# Patient Record
Sex: Male | Born: 1970
Health system: Southern US, Community
[De-identification: ages and names within clinical notes are randomized; demographics above are authoritative.]

## PROBLEM LIST (undated history)

## (undated) DIAGNOSIS — R600 Localized edema: Secondary | ICD-10-CM

## (undated) DIAGNOSIS — N189 Chronic kidney disease, unspecified: Secondary | ICD-10-CM

## (undated) DIAGNOSIS — G4730 Sleep apnea, unspecified: Secondary | ICD-10-CM

## (undated) DIAGNOSIS — M1990 Unspecified osteoarthritis, unspecified site: Secondary | ICD-10-CM

## (undated) HISTORY — PX: REPLACEMENT TOTAL HIP W/  RESURFACING IMPLANTS: SUR1222

---

## 2003-04-29 ENCOUNTER — Encounter: Payer: Self-pay | Admitting: Emergency Medicine

## 2003-04-29 ENCOUNTER — Emergency Department (HOSPITAL_COMMUNITY): Admission: EM | Admit: 2003-04-29 | Discharge: 2003-04-29 | Payer: Self-pay | Admitting: Emergency Medicine

## 2007-04-01 ENCOUNTER — Ambulatory Visit: Payer: Self-pay | Admitting: Family Medicine

## 2011-03-11 ENCOUNTER — Encounter: Payer: Self-pay | Admitting: Nurse Practitioner

## 2011-03-28 ENCOUNTER — Encounter: Payer: Self-pay | Admitting: Nurse Practitioner

## 2011-04-27 ENCOUNTER — Encounter: Payer: Self-pay | Admitting: Nurse Practitioner

## 2012-10-06 ENCOUNTER — Ambulatory Visit: Payer: Self-pay | Admitting: Internal Medicine

## 2012-11-16 ENCOUNTER — Ambulatory Visit: Payer: Self-pay | Admitting: Physician Assistant

## 2012-11-22 ENCOUNTER — Ambulatory Visit: Payer: Self-pay | Admitting: Physician Assistant

## 2013-01-13 ENCOUNTER — Other Ambulatory Visit: Payer: Self-pay | Admitting: Anesthesiology

## 2013-01-18 ENCOUNTER — Encounter (HOSPITAL_COMMUNITY): Payer: Self-pay

## 2013-01-18 ENCOUNTER — Encounter (HOSPITAL_COMMUNITY)
Admission: RE | Admit: 2013-01-18 | Discharge: 2013-01-18 | Disposition: A | Discharge status: Home / Self Care | Payer: BC Managed Care – PPO | Source: Ambulatory Visit | Attending: Anesthesiology | Admitting: Anesthesiology

## 2013-01-18 HISTORY — DX: Localized edema: R60.0

## 2013-01-18 HISTORY — DX: Chronic kidney disease, unspecified: N18.9

## 2013-01-18 HISTORY — DX: Unspecified osteoarthritis, unspecified site: M19.90

## 2013-01-18 HISTORY — DX: Sleep apnea, unspecified: G47.30

## 2013-01-18 LAB — CBC
HCT: 44.2 % (ref 39.0–52.0)
Hemoglobin: 15 g/dL (ref 13.0–17.0)
MCH: 30.2 pg (ref 26.0–34.0)
MCV: 88.9 fL (ref 78.0–100.0)
RBC: 4.97 MIL/uL (ref 4.22–5.81)

## 2013-01-18 LAB — SURGICAL PCR SCREEN
MRSA, PCR: NEGATIVE
Staphylococcus aureus: NEGATIVE

## 2013-01-18 LAB — BASIC METABOLIC PANEL
CO2: 27 mEq/L (ref 19–32)
Calcium: 9.3 mg/dL (ref 8.4–10.5)
Chloride: 104 mEq/L (ref 96–112)
Glucose, Bld: 92 mg/dL (ref 70–99)
Potassium: 4.6 mEq/L (ref 3.5–5.1)
Sodium: 138 mEq/L (ref 135–145)

## 2013-01-18 NOTE — H&P (Signed)
[name] is an 42 y.o. male.   Chief Complaint: low back pain, radiculopathy HPI: 42 year old morbidly obese man with 4 year history of waxing and waning back pain, with advancing radiation into the buttocks and thighs. MRI demonstrates mild to moderate disc changes.    Past Medical History  Diagnosis Date  . Sleep apnea   . Chronic kidney disease     kidney stones  . Arthritis   . Edema leg     No past surgical history on file.  No family history on file. Social History:  reports that he has never smoked. He does not have any smokeless tobacco history on file. He reports that  drinks alcohol. He reports that he does not use illicit drugs.  Allergies: No Known Allergies  No prescriptions prior to admission    Results for orders placed during the hospital encounter of 01/18/13 (from the past 48 hour(s))  BASIC METABOLIC PANEL     Status: None   Collection Time    01/18/13 10:51 AM      Result Value Range   Sodium 138  135 - 145 mEq/L   Potassium 4.6  3.5 - 5.1 mEq/L   Chloride 104  96 - 112 mEq/L   CO2 27  19 - 32 mEq/L   Glucose, Bld 92  70 - 99 mg/dL   BUN 15  6 - 23 mg/dL   Creatinine, Ser 7.82  0.50 - 1.35 mg/dL   Calcium 9.3  8.4 - 95.6 mg/dL   GFR calc non Af Amer >90  >90 mL/min   GFR calc Af Amer >90  >90 mL/min   Comment:            The eGFR has been calculated     using the CKD EPI equation.     This calculation has not been     validated in all clinical     situations.     eGFR's persistently     <90 mL/min signify     possible Chronic Kidney Disease.  CBC     Status: None   Collection Time    01/18/13 10:51 AM      Result Value Range   WBC 8.7  4.0 - 10.5 K/uL   RBC 4.97  4.22 - 5.81 MIL/uL   Hemoglobin 15.0  13.0 - 17.0 g/dL   HCT 21.3  08.6 - 57.8 %   MCV 88.9  78.0 - 100.0 fL   MCH 30.2  26.0 - 34.0 pg   MCHC 33.9  30.0 - 36.0 g/dL   RDW 46.9  62.9 - 52.8 %   Platelets 217  150 - 400 K/uL  SURGICAL PCR SCREEN     Status: None   Collection Time    01/18/13 10:51 AM      Result Value Range   MRSA, PCR NEGATIVE  NEGATIVE   Staphylococcus aureus NEGATIVE  NEGATIVE   Comment:            The Xpert SA Assay (FDA     approved for NASAL specimens     in patients over 57 years of age),     is one component of     a comprehensive surveillance     program.  Test performance has     been validated by [employer] for patients greater     than or equal to 45 year old.     It is not intended  to diagnose infection nor to     guide or monitor treatment.   No results found.  Review of Systems  Constitutional: Negative.   HENT: Negative.  Negative for neck pain.   Eyes: Negative.   Respiratory: Negative.   Cardiovascular: Negative.   Gastrointestinal: Negative.   Musculoskeletal: Positive for back pain and joint pain. Negative for myalgias and falls.  Skin: Negative.   Endo/Heme/Allergies: Negative.   Psychiatric/Behavioral: Negative.     There were no vitals taken for this visit. Physical Exam  Constitutional: He is oriented to person, place, and time. He appears well-developed and well-nourished.  HENT:  Head: Normocephalic and atraumatic.  Eyes: EOM are normal. Pupils are equal, round, and reactive to light.  Neck: Normal range of motion.  Cardiovascular: Normal rate, regular rhythm and normal heart sounds.   Respiratory: Effort normal and breath sounds normal.  Musculoskeletal: Normal range of motion.  Neurological: He is alert and oriented to person, place, and time.  Skin: Skin is warm and dry.  Psychiatric: He has a normal mood and affect. His behavior is normal. Judgment and thought content normal.     Assessment/Plan 400+ lb individual with DDD Plan LESI at L4-5. Done in hospital setting due to patient's habitus requiring special equipment including bariatric bed, extra personnel.   Gwynne Edinger 01/19/2013, 3:14 PM

## 2013-01-18 NOTE — Pre-Procedure Instructions (Signed)
Aundra Espin  01/18/2013   Your procedure is scheduled on:  Wednesday, March 26th  Report to [facility] Stay Center at 100 PM.  Call this number if you have problems the morning of surgery: [phone]   Remember:   Do not eat food or drink liquids after midnight.    Take these medicines the morning of surgery with A SIP OF WATER: none   Do not wear jewelry, make-up or nail polish.  Do not wear lotions, powders, or perfumes,deodorant.  Do not shave 48 hours prior to surgery. Men may shave face and neck.  Do not bring valuables to the hospital.  Contacts, dentures or bridgework may not be worn into surgery.  Leave suitcase in the car. After surgery it may be brought to your room.  For patients admitted to the hospital, checkout time is 11:00 AM the day of discharge.   Patients discharged the day of surgery will not be allowed to drive home.   Special Instructions: Shower using CHG 2 nights before surgery and the night before surgery.  If you shower the day of surgery use CHG.  Use special wash - you have one bottle of CHG for all showers.  You should use approximately 1/3 of the bottle for each shower.   Please read over the following fact sheets that you were given: Pain Booklet, Coughing and Deep Breathing, MRSA Information and Surgical Site Infection Prevention

## 2013-01-19 ENCOUNTER — Ambulatory Visit (HOSPITAL_COMMUNITY)
Admission: RE | Admit: 2013-01-19 | Discharge: 2013-01-19 | Disposition: A | Discharge status: Home / Self Care | Payer: BC Managed Care – PPO | Source: Ambulatory Visit | Attending: Anesthesiology | Admitting: Anesthesiology

## 2013-01-19 ENCOUNTER — Ambulatory Visit (HOSPITAL_COMMUNITY): Payer: BC Managed Care – PPO

## 2013-01-19 ENCOUNTER — Ambulatory Visit (HOSPITAL_COMMUNITY): Payer: BC Managed Care – PPO | Admitting: Anesthesiology

## 2013-01-19 ENCOUNTER — Encounter (HOSPITAL_COMMUNITY)
Admission: RE | Disposition: A | Discharge status: Home / Self Care | Payer: Self-pay | Source: Ambulatory Visit | Attending: Anesthesiology

## 2013-01-19 ENCOUNTER — Encounter (HOSPITAL_COMMUNITY): Payer: Self-pay | Admitting: Anesthesiology

## 2013-01-19 ENCOUNTER — Encounter (HOSPITAL_COMMUNITY): Payer: Self-pay | Admitting: *Deleted

## 2013-01-19 DIAGNOSIS — M51379 Other intervertebral disc degeneration, lumbosacral region without mention of lumbar back pain or lower extremity pain: Secondary | ICD-10-CM | POA: Insufficient documentation

## 2013-01-19 DIAGNOSIS — M129 Arthropathy, unspecified: Secondary | ICD-10-CM | POA: Insufficient documentation

## 2013-01-19 DIAGNOSIS — M5137 Other intervertebral disc degeneration, lumbosacral region: Secondary | ICD-10-CM | POA: Insufficient documentation

## 2013-01-19 DIAGNOSIS — G4730 Sleep apnea, unspecified: Secondary | ICD-10-CM | POA: Insufficient documentation

## 2013-01-19 DIAGNOSIS — R609 Edema, unspecified: Secondary | ICD-10-CM | POA: Insufficient documentation

## 2013-01-19 HISTORY — PX: STERIOD INJECTION: SHX5046

## 2013-01-19 SURGERY — STEROID INJECTION
Anesthesia: Monitor Anesthesia Care | Site: Back | Wound class: Clean

## 2013-01-19 MED ORDER — FENTANYL CITRATE 0.05 MG/ML IJ SOLN
INTRAMUSCULAR | Status: DC | PRN
  Administered 2013-01-19: 50 ug via INTRAVENOUS

## 2013-01-19 MED ORDER — OXYCODONE HCL 5 MG PO TABS: 5.0000 mg | ORAL_TABLET | Freq: Once | ORAL | Status: DC | PRN

## 2013-01-19 MED ORDER — LACTATED RINGERS IV SOLN
INTRAVENOUS | Status: DC | PRN
  Administered 2013-01-19: 16:00:00 via INTRAVENOUS

## 2013-01-19 MED ORDER — MIDAZOLAM HCL 5 MG/5ML IJ SOLN
INTRAMUSCULAR | Status: DC | PRN
  Administered 2013-01-19: 2 mg via INTRAVENOUS

## 2013-01-19 MED ORDER — LIDOCAINE HCL (PF) 1 % IJ SOLN
INTRAMUSCULAR | Status: DC | PRN
  Administered 2013-01-19: 30 mL

## 2013-01-19 MED ORDER — IOHEXOL 300 MG/ML  SOLN
INTRAMUSCULAR | Status: DC | PRN
  Administered 2013-01-19: 2 mL

## 2013-01-19 MED ORDER — BUPIVACAINE HCL 0.25 % IJ SOLN
INTRAMUSCULAR | Status: DC | PRN
  Administered 2013-01-19: 1 mL

## 2013-01-19 MED ORDER — MEPERIDINE HCL 25 MG/ML IJ SOLN: 6.2500 mg | INTRAMUSCULAR | Status: DC | PRN

## 2013-01-19 MED ORDER — HYDROMORPHONE HCL PF 1 MG/ML IJ SOLN: 0.2500 mg | INTRAMUSCULAR | Status: DC | PRN

## 2013-01-19 MED ORDER — IOHEXOL 300 MG/ML  SOLN
INTRAMUSCULAR | Status: DC | PRN
  Administered 2013-01-19: 50 mL

## 2013-01-19 MED ORDER — OXYCODONE HCL 5 MG/5ML PO SOLN: 5.0000 mg | Freq: Once | ORAL | Status: DC | PRN

## 2013-01-19 MED ORDER — ONDANSETRON HCL 4 MG/2ML IJ SOLN: 4.0000 mg | Freq: Once | INTRAMUSCULAR | Status: DC | PRN

## 2013-01-19 MED ORDER — TRIAMCINOLONE ACETONIDE 40 MG/ML IJ SUSP: INTRAMUSCULAR | Status: DC | PRN

## 2013-01-19 MED ORDER — BUPIVACAINE HCL (PF) 0.25 % IJ SOLN: INTRAMUSCULAR | Status: DC | PRN

## 2013-01-19 MED ORDER — LIDOCAINE HCL 1 % IJ SOLN
INTRAMUSCULAR | Status: DC | PRN
  Administered 2013-01-19: 3 mL

## 2013-01-19 MED ORDER — TRIAMCINOLONE ACETONIDE 40 MG/ML IJ SUSP
INTRAMUSCULAR | Status: DC | PRN
  Administered 2013-01-19: 2 mL via INTRAMUSCULAR

## 2013-01-19 MED ORDER — TRIAMCINOLONE ACETONIDE 40 MG/ML IJ SUSP
40.0000 mg | INTRAMUSCULAR | Status: AC
  Administered 2013-01-19: 40 mg via INTRAMUSCULAR
  Filled 2013-01-19: qty 1

## 2013-01-19 SURGICAL SUPPLY — 14 items
CLOTH BEACON ORANGE TIMEOUT ST (SAFETY) ×2 IMPLANT
CONT SPEC STER OR (MISCELLANEOUS) ×8 IMPLANT
GLOVE BIOGEL PI IND STRL 7.0 (GLOVE) ×2 IMPLANT
GLOVE BIOGEL PI INDICATOR 7.0 (GLOVE) ×2
GLOVE ECLIPSE 7.5 STRL STRAW (GLOVE) ×2 IMPLANT
GLOVE EXAM NITRILE LRG STRL (GLOVE) IMPLANT
GLOVE EXAM NITRILE MD LF STRL (GLOVE) IMPLANT
GLOVE EXAM NITRILE XL STR (GLOVE) IMPLANT
GLOVE EXAM NITRILE XS STR PU (GLOVE) IMPLANT
GOWN BRE IMP SLV AUR XL STRL (GOWN DISPOSABLE) ×2 IMPLANT
GOWN STRL REIN 2XL LVL4 (GOWN DISPOSABLE) IMPLANT
KIT ROOM TURNOVER OR (KITS) ×2 IMPLANT
TOWEL OR 17X24 6PK STRL BLUE (TOWEL DISPOSABLE) ×2 IMPLANT
TOWEL OR 17X26 10 PK STRL BLUE (TOWEL DISPOSABLE) ×2 IMPLANT

## 2013-01-19 NOTE — Anesthesia Preprocedure Evaluation (Signed)
Anesthesia Evaluation  Patient identified by MRN, date of birth, ID band Patient awake    Reviewed: Allergy & Precautions, H&P , NPO status , Patient's Chart, lab work & pertinent test results  Airway Mallampati: II TM Distance: >3 FB Neck ROM: Full    Dental   Pulmonary sleep apnea ,          Cardiovascular     Neuro/Psych    GI/Hepatic   Endo/Other    Renal/GU      Musculoskeletal   Abdominal   Peds  Hematology   Anesthesia Other Findings   Reproductive/Obstetrics                           Anesthesia Physical Anesthesia Plan  ASA: III  Anesthesia Plan: MAC   Post-op Pain Management:    Induction: Intravenous  Airway Management Planned: Natural Airway  Additional Equipment:   Intra-op Plan:   Post-operative Plan:   Informed Consent: I have reviewed the patients History and Physical, chart, labs and discussed the procedure including the risks, benefits and alternatives for the proposed anesthesia with the patient or authorized representative who has indicated his/her understanding and acceptance.     Plan Discussed with: CRNA and Surgeon  Anesthesia Plan Comments:         Anesthesia Quick Evaluation

## 2013-01-19 NOTE — Anesthesia Postprocedure Evaluation (Signed)
Anesthesia Post Note  Patient: [name]  Procedure(s) Performed: Procedure(s) (LRB): STEROID INJECTION (N/A)  Anesthesia type: general  Patient location: PACU  Post pain: Pain level controlled  Post assessment: Patient's Cardiovascular Status Stable  Last Vitals:  Filed Vitals:   01/19/13 1611  BP: 117/62  Pulse: 73  Temp:   Resp: 28    Post vital signs: Reviewed and stable  Level of consciousness: sedated  Complications: No apparent anesthesia complications

## 2013-01-19 NOTE — Anesthesia Procedure Notes (Signed)
Procedure Name: MAC Date/Time: 01/19/2013 3:35 PM Performed by: Angelica Pou Pre-anesthesia Checklist: Patient identified, Emergency Drugs available, Suction available, Patient being monitored and Timeout performed Oxygen Delivery Method: Nasal cannula

## 2013-01-19 NOTE — Op Note (Signed)
PREOP DX: POSTOP DX:  PROCEDURE: SURGEON: Omunique Pederson ANESTH: mac + local  PROCEDURE: 1) Intraop flouro   2) lumbar epidural steroid injection at L4-5     DESCRIPTION: After a discussion of risks, benefits and alternatives, informed consent was obtained. The patient was taken to the OR, placed on a bariatric bed in the prone position, MAC anesthesia provided by the anesthesia team.   After a timeout was taken and all things deemed to be appropriate, the area overlying the lumbar spine was prepped with chloraprep and draped. AP fluoro was used to identify the L4-5 interspace. The skin and subcutaneous tissues were anesthetized with 1% lidocaine, and a 18g Tuohy needle introduced and advanced under intermittent fluoroscopy to direct the needle in a midline fashion. Biplanar fluoro as well as loss-of-resistance technique were used to enter the epidural space and under live AP and lateral fluoro a good epidurogram was obtained injecting approximately 3 cc of omnipaque contrast. Subsequently, 80 mg of triamcinalone and 1cc of 0.25 % bupivicaine were incrementially injected. The needle was withdrawn intact, hemostasis was appreciated, and the patient taken to the recovery area.   COMPLICATIONS: none CONDITION: stable throughout DISPOSITION: home with followup to be established

## 2013-01-19 NOTE — Transfer of Care (Signed)
Immediate Anesthesia Transfer of Care Note  Patient: [name]  Procedure(s) Performed: Procedure(s) with comments: STEROID INJECTION (N/A) - Lumbar four - five Lumbar Epidural Steroid Injection  Patient Location: PACU  Anesthesia Type:MAC  Level of Consciousness: awake, alert , oriented and patient cooperative  Airway & Oxygen Therapy: Patient Spontanous Breathing  Post-op Assessment: Report given to PACU RN, Post -op Vital signs reviewed and stable and Patient moving all extremities X 4  Post vital signs: Reviewed and stable  Complications: No apparent anesthesia complications

## 2013-01-19 NOTE — Preoperative (Signed)
Beta Blockers   Reason not to administer Beta Blockers:Not Applicable 

## 2013-01-20 ENCOUNTER — Encounter (HOSPITAL_COMMUNITY): Payer: Self-pay | Admitting: Anesthesiology

## 2013-06-24 ENCOUNTER — Ambulatory Visit: Payer: Self-pay | Admitting: Physician Assistant

## 2013-07-08 ENCOUNTER — Ambulatory Visit: Payer: Self-pay | Admitting: Orthopedic Surgery

## 2014-10-27 IMAGING — CT CT STONE STUDY
1 of 2 series · 16 of 32 positions shown, 20 images · non-contrast
Comparison: none

REASON FOR EXAM: Hematuria Hx Kidney Stones
COMMENTS:

PROCEDURE:     KCT - KCT ABDOMEN/PELVIS WO (STONE)  - October 06, 2012  [DATE]
RESULT:     History: Hematuria. Kidney stones.
Comparison Study: No prior.

[Series 2: abdpel_xl 3.0 i40s 3 · axial · 0.98mm/px · z∈[-1253,-821]mm · 16 of 158 slices shown, 20 images]
[im 7/158  soft-tissue]
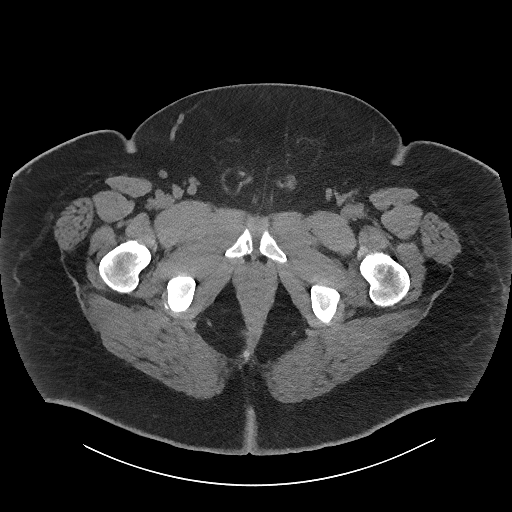
[im 7/158  bone]
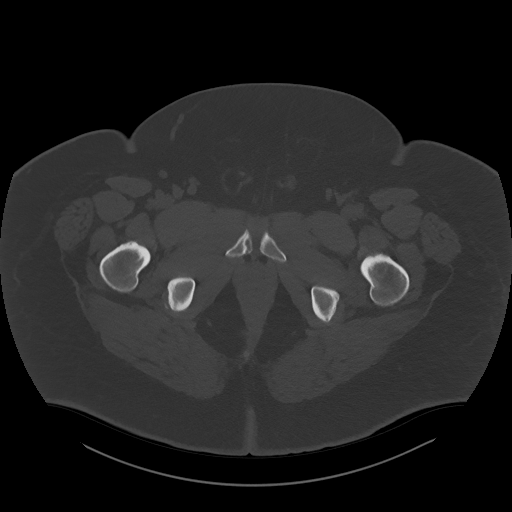
[im 19/158  soft-tissue]
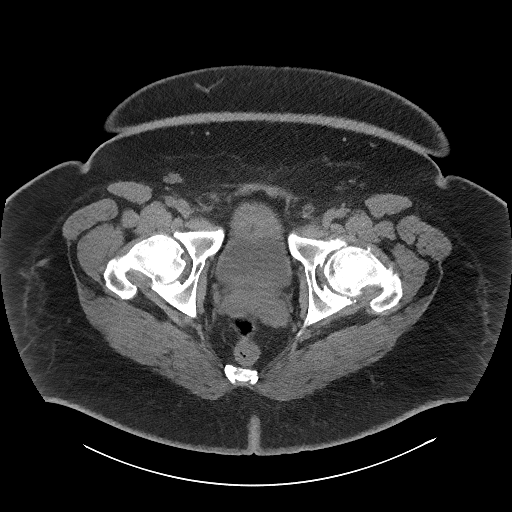
[im 31/158  soft-tissue]
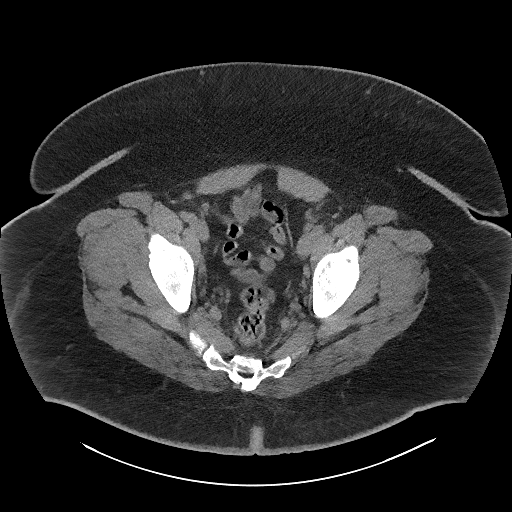
[im 43/158  soft-tissue]
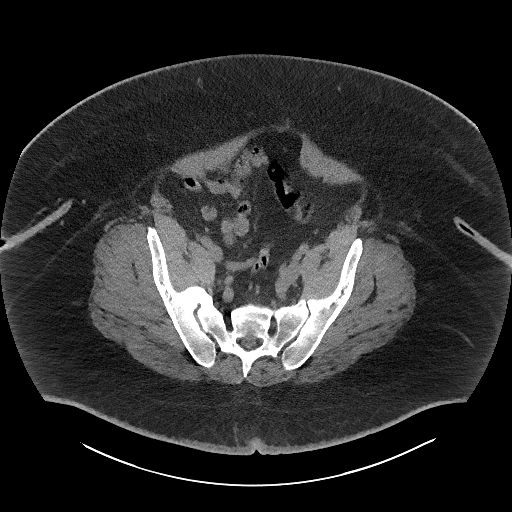
[im 55/158  soft-tissue]
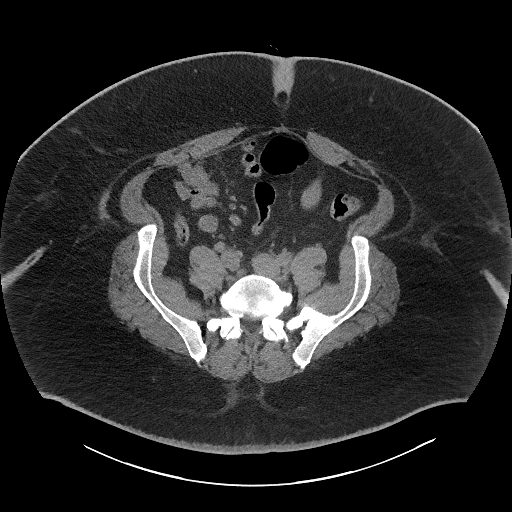
[im 61/158  soft-tissue]
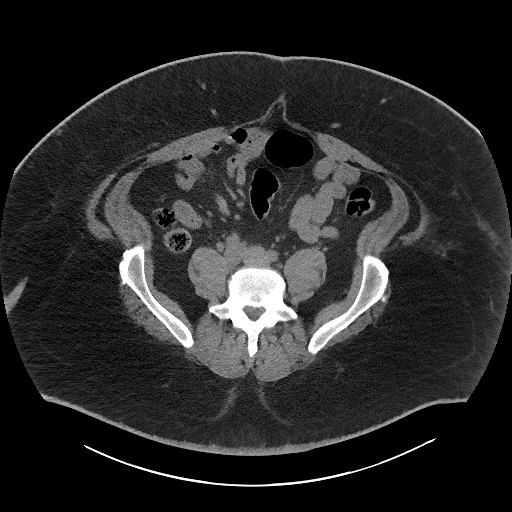
[im 73/158  soft-tissue]
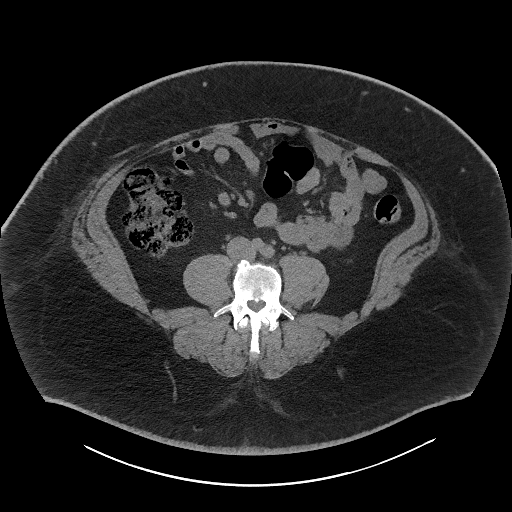
[im 85/158  soft-tissue]
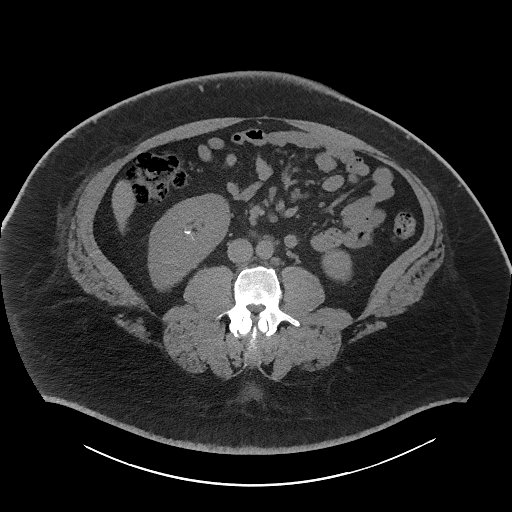
[im 97/158  soft-tissue]
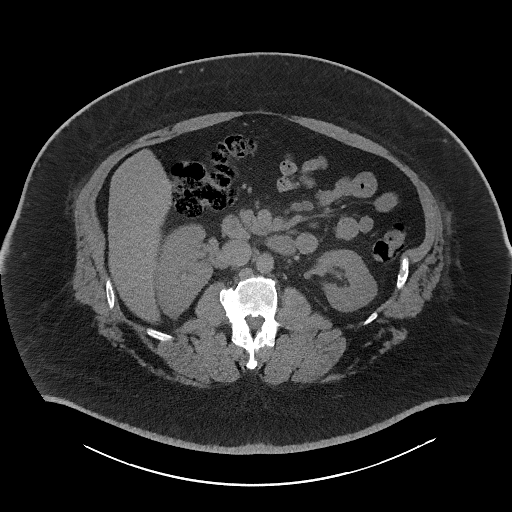
[im 97/158  bone]
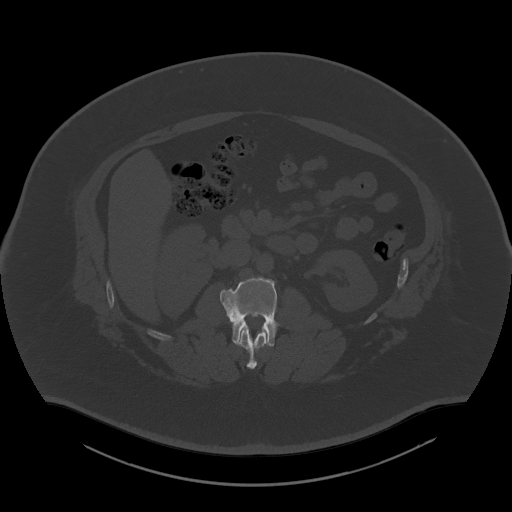
[im 103/158  soft-tissue]
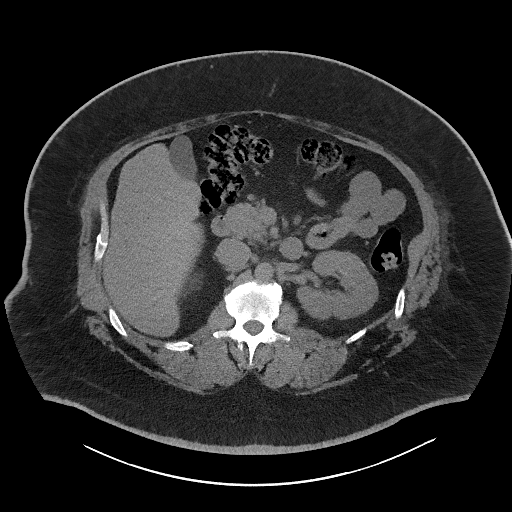
[im 115/158  soft-tissue]
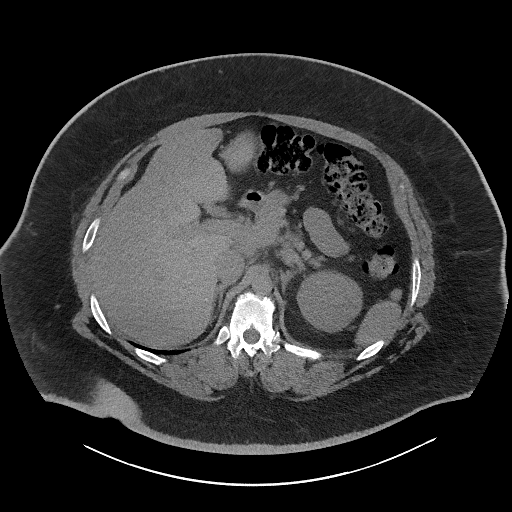
[im 127/158  soft-tissue]
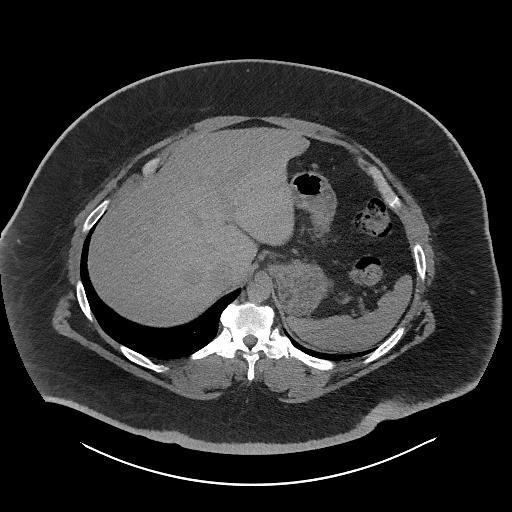
[im 133/158  lung]
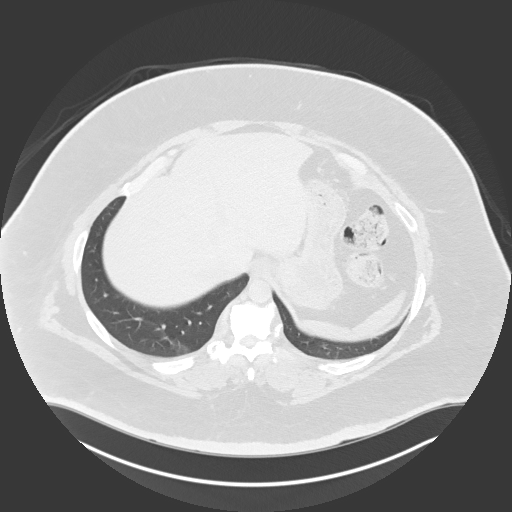
[im 139/158  soft-tissue]
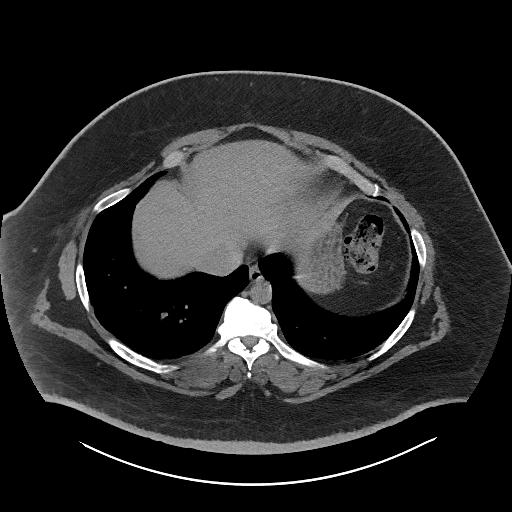
[im 139/158  lung]
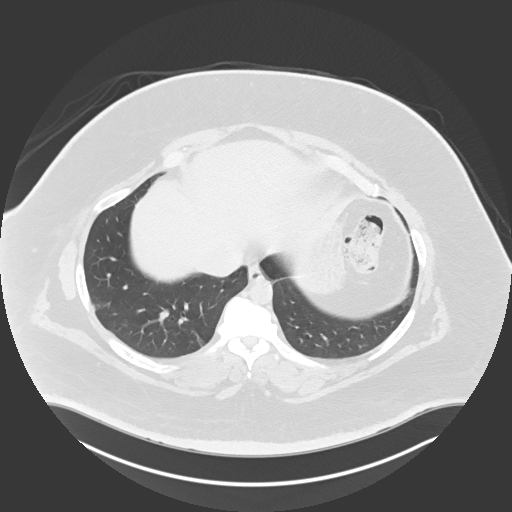
[im 145/158  lung]
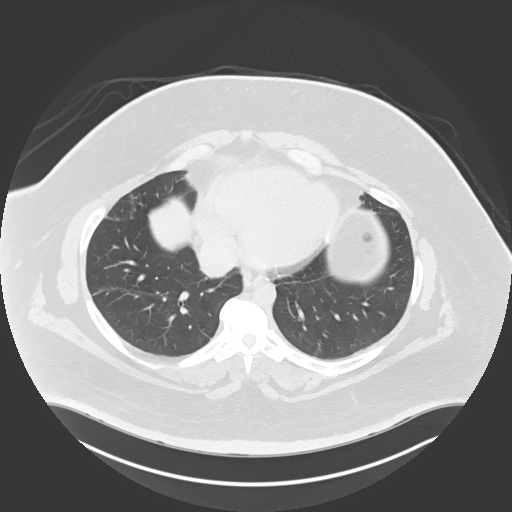
[im 151/158  soft-tissue]
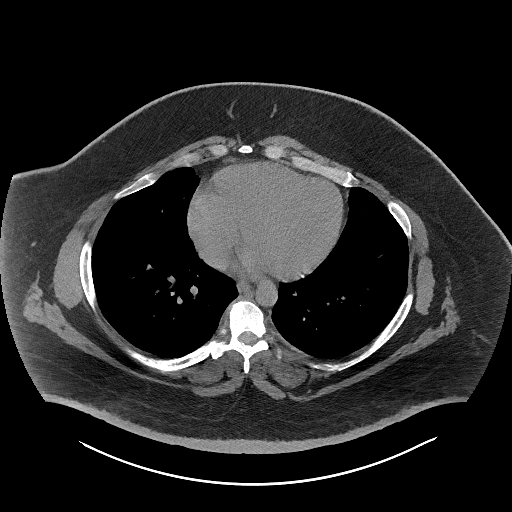
[im 151/158  lung]
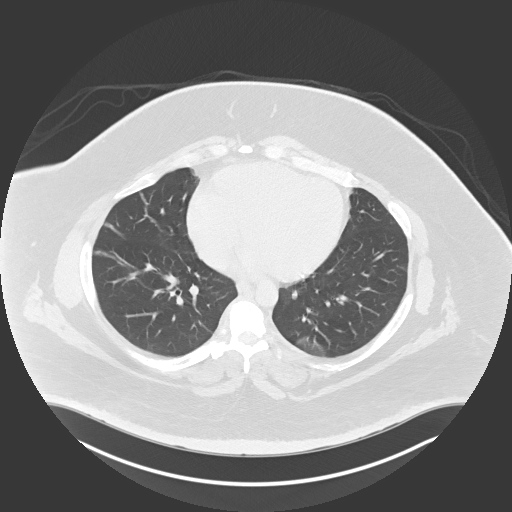

[16 of 32 positions shown; findings below may reference images not displayed]

FINDINGS: Standard nonenhanced CT obtained. Liver normal. Gallbladder
normal. Spleen normal. Pancreas normal. Adrenals normal. Bilateral
nephrolithiasis. No hydronephrosis or hydroureter. Aorta normal caliber.
Shotty retroperitoneal lymph nodes. Appendiceal region normal. Appendix not
definitely identified. No inflammatory changes in the right or left lower
quadrant. There is no bowel distention. No free air. Mild atelectasis lung
bases.
IMPRESSION: Bilateral tiny renal calyceal stones. No evidence of
obstructing ureteral stone.

## 2015-02-25 ENCOUNTER — Emergency Department
Admission: EM | Admit: 2015-02-25 | Discharge: 2015-02-26 | Disposition: A | Discharge status: Home / Self Care | Payer: No Typology Code available for payment source | Attending: Emergency Medicine | Admitting: Emergency Medicine

## 2015-02-25 ENCOUNTER — Encounter: Payer: Self-pay | Admitting: *Deleted

## 2015-02-25 DIAGNOSIS — Z791 Long term (current) use of non-steroidal anti-inflammatories (NSAID): Secondary | ICD-10-CM | POA: Insufficient documentation

## 2015-02-25 DIAGNOSIS — K5641 Fecal impaction: Secondary | ICD-10-CM | POA: Insufficient documentation

## 2015-02-25 DIAGNOSIS — Z79899 Other long term (current) drug therapy: Secondary | ICD-10-CM | POA: Insufficient documentation

## 2015-02-25 MED ORDER — FLEET ENEMA 7-19 GM/118ML RE ENEM
1.0000 | ENEMA | Freq: Once | RECTAL | Status: AC
  Administered 2015-02-25: 1 via RECTAL

## 2015-02-25 NOTE — ED Notes (Signed)
Pt resent post-op hip surgery. Pt presents w/ c/o rectal and abdominal pain consistent w/ obstipation secondary to narcotic use. Pt states last normal x 4 days ago. Pt has tachypnea, is pale and diaphoretic. Pt states he can feel stool in rectum but cannot eliminate.

## 2015-02-25 NOTE — ED Provider Notes (Signed)
[facility] Emergency Department Provider Note    ____________________________________________  Time seen: 10:20 PM  I have reviewed the triage vital signs and the nursing notes.   HISTORY  Chief Complaint Fecal Impaction       HPI [name] is a 44 y.o. male with a left hip replacement on April 19 and presents today without a bowel movement in 3-4 days. The patient says he feels stool at his anal verge but is unable to push it out. He says that coming in waves he is having severe pain with distress. He says that his wife tried to manually disimpact him at home however was unsuccessful. He also denies passing gas. He does not have any nausea or vomiting. The pain has been worsening over the past 3 days. He says he is taking 2 doses of oxycodone every 4 hours with his last dose at 8 AM this morning.  At this time he says he has abdominal discomfort with rectal pain. He is not experiencing any pain in his left hip. He says that he has not needed the oxycodone for substance 8 AM this morning and plans to wean himself off.   Past Medical History  Diagnosis Date  . Sleep apnea   . Chronic kidney disease     kidney stones  . Arthritis   . Edema leg     There are no active problems to display for this patient.   Past Surgical History  Procedure Laterality Date  . Steriod injection N/A 01/19/2013    Procedure: STEROID INJECTION;  Surgeon: Gwynne Edinger, MD;  Location: [facility];  Service: Neurosurgery;  Laterality: N/A;  Lumbar four - five Lumbar Epidural Steroid Injection  . Replacement total hip w/  resurfacing implants Left     Current Outpatient Rx  Name  Route  Sig  Dispense  Refill  . oxycodone (OXY-IR) 5 MG capsule   Oral   Take 5 mg by mouth every 4 (four) hours as needed.         . phentermine 37.5 MG capsule   Oral   Take 37.5 mg by mouth every morning.         . rivaroxaban (XARELTO) 10 MG TABS tablet   Oral   Take 10 mg  by mouth daily.         Marland Kitchen senna (SENOKOT) 8.6 MG tablet   Oral   Take 1 tablet by mouth daily.         . cyclobenzaprine (FLEXERIL) 10 MG tablet   Oral   Take 10 mg by mouth 3 (three) times daily as needed for muscle spasms.         . furosemide (LASIX) 20 MG tablet   Oral   Take 20 mg by mouth daily as needed (leg swelling).         . naproxen sodium (ANAPROX) 220 MG tablet   Oral   Take 220 mg by mouth 2 (two) times daily with a meal.           Allergies Review of patient's allergies indicates no known allergies.  No family history on file.  Social History History  Substance Use Topics  . Smoking status: Never Smoker   . Smokeless tobacco: Never Used  . Alcohol Use: Yes     Comment: occasionally    Review of Systems  Constitutional: Negative for fever. Eyes: Negative for visual changes. ENT: Negative for sore throat. Cardiovascular: Negative for chest pain.  Respiratory: Negative for shortness of breath. Gastrointestinal: Abdominal discomfort without pain. Which is suprapubic. Genitourinary: Negative for dysuria. Musculoskeletal: Negative for back pain. Skin: Negative for rash. Neurological: Negative for headaches, focal weakness or numbness.   10-point ROS otherwise negative.  ____________________________________________   PHYSICAL EXAM:  VITAL SIGNS: ED Triage Vitals  Enc Vitals Group     BP 02/25/15 2156 100/75 mmHg     Pulse Rate 02/25/15 2152 113     Resp 02/25/15 2152 28     Temp 02/25/15 2152 98.6 F (37 C)     Temp Source 02/25/15 2152 Oral     SpO2 02/25/15 2152 100 %     Weight 02/25/15 2152 375 lb (170.099 kg)     Height 02/25/15 2152 6' (1.829 m)     Head Cir --      Peak Flow --      Pain Score --      Pain Loc --      Pain Edu? --      Excl. in GC? --      Constitutional: Alert and oriented. Patient walking around with walker. Screaming and diaphoretic.  Eyes: Conjunctivae are normal. PERRL. Normal extraocular  movements. ENT   Head: Normocephalic and atraumatic.   Nose: No congestion/rhinnorhea.   Mouth/Throat: Mucous membranes are moist.   Neck: No stridor. Hematological/Lymphatic/Immunilogical: No cervical lymphadenopathy. Cardiovascular: Normal rate, regular rhythm. Normal and symmetric distal pulses are present in all extremities. No murmurs, rubs, or gallops. Respiratory: Normal respiratory effort without tachypnea nor retractions. Breath sounds are clear and equal bilaterally. No wheezes/rales/rhonchi. Gastrointestinal: Soft and nontender. No distention. No abdominal bruits. There is no CVA tenderness. Small external hemorrhoid which is not engorged at 6:00. Genitourinary: Deferred Musculoskeletal: Nontender with normal range of motion in all extremities. No joint effusions.  Bilateral lower extremity edema which the patient says he has had since his surgery. Left hip dressing CDI. The incision site is without surrounding erythema without any signs of dehiscence or pus. Neurologic:  Normal speech and language. No gross focal neurologic deficits are appreciated. Speech is normal. No gait instability. Skin:  Skin is warm, dry and intact. No rash noted. Psychiatric: Mood and affect are normal. Speech and behavior are normal. Patient exhibits appropriate insight and judgment.  ____________________________________________   EKG   ____________________________________________    RADIOLOGY    ____________________________________________   PROCEDURES  Procedure(s) performed: fecal, see procedure note(s). Patient on 2 doses of oxycodone every 4 for left hip pain without bowel movement in 3-4 days. Patient laid in right lateral decubitus. Double gloved with gown. Use lubricant. Able to digitally disimpact a large amount of hard stool in the rectum. Grossly brown. 3 passes with large disimpaction but still with large amount of stool in vault. Stopped due to small amount of blood  mixed in with stool on the third pass. Will attempt enema on patient.   ____________________________________________   INITIAL IMPRESSION / ASSESSMENT AND PLAN / ED COURSE  Pertinent labs & imaging results that were available during my care of the patient were reviewed by me and considered in my medical decision making (see chart for details).  ----------------------------------------- 11:43 PM on 02/25/2015 -----------------------------------------  Patient reassessed and pain is greatly improved. Heart rate is 90 on the monitor. Patient received enema. However, he was not able to have any solid bowel movement. He did pass a small amount of liquid stool as well as a small amount of flatus. The patient says he is not  any need of continuing his oxycodone. He will also continue his stool softeners at home as needed. Will replace dressing over incision site.  ____________________________________________   FINAL CLINICAL IMPRESSION(S) / ED DIAGNOSES  Acute fecal impaction secondary to opiate use after surgery. Acute, and initial visit.   Arelia Longest, MD 02/25/15 [phone]

## 2015-02-25 NOTE — Discharge Instructions (Signed)
Fecal Impaction °A fecal impaction happens when there is a large, firm amount of stool (or feces) that cannot be passed. The impacted stool is usually in the rectum, which is the lowest part of the large bowel. The impacted stool can block the colon and cause significant problems. °CAUSES  °The longer stool stays in the rectum, the harder it gets. Anything that slows down your bowel movements can lead to fecal impaction, such as: °· Constipation. This can be a long-standing (chronic) problem or can happen suddenly (acute). °· Painful conditions of the rectum, such as hemorrhoids or anal fissures. The pain of these conditions can make you try to avoid having bowel movements. °· Narcotic pain-relieving medicines, such as methadone, morphine, or codeine. °· Not drinking enough fluids. °· Inactivity and bed rest over long periods of time. °· Diseases of the brain or nervous system that damage the nerves controlling the muscles of the intestines. °SIGNS AND SYMPTOMS  °· Lack of normal bowel movements or changes in bowel patterns. °· Sense of fullness in the rectum but unable to pass stool. °· Pain or cramps in the abdominal area (often after meals). °· Thin, watery discharge from the rectum. °DIAGNOSIS  °Your health care provider may suspect that you have a fecal impaction based on your symptoms and a physical exam. This will include an exam of your rectum. Sometimes X-rays or lab testing may be needed to confirm the diagnosis and to be sure there are no other problems.  °TREATMENT  °· Initially an impaction can be removed manually. Using a gloved finger, your health care provider can remove hard stool from your rectum. °· Medicine is sometimes needed. A suppository or enema can be given in the rectum to soften the stool, which can stimulate a bowel movement. Medicines can also be given by mouth (orally). °· Though rare, surgery may be needed if the colon has torn (perforated) due to blockage. °HOME CARE INSTRUCTIONS   °· Develop regular bowel habits. This could include getting in the habit of having a bowel movement after your morning cup of coffee or after eating. Be sure to allow yourself enough time on the toilet. °· Maintain a high-fiber diet. °· Drink enough fluids to keep your urine clear or pale yellow as directed by your health care provider. °· Exercise regularly. °· If you begin to get constipated, increase the amount of fiber in your diet. Eat plenty of fruits, vegetables, whole wheat breads, bran, oatmeal, and similar products. °· Take natural fiber laxatives or other laxatives only as directed by your health care provider. °SEEK MEDICAL CARE IF:  °· You have ongoing rectal pain. °· You require enemas or suppositories more than twice a week. °· You have rectal bleeding. °· You have continued problems, or you develop abdominal pain. °· You have thin, pencil-like stools. °SEEK IMMEDIATE MEDICAL CARE IF:  °You have black or tarry stools. °MAKE SURE YOU:  °· Understand these instructions. °· Will watch your condition. °· Will get help right away if you are not doing well or get worse. °Document Released: 07/05/2004 Document Revised: 08/03/2013 Document Reviewed: 04/19/2013 °[employer]® Patient Information ©2015 [employer]. This information is not intended to replace advice given to you by your health care provider. Make sure you discuss any questions you have with your health care provider. ° °

## 2015-02-26 NOTE — ED Notes (Signed)
Pt reports good relief of discomfort and enema effective in resulting in large formed BM; pt deemed medically cleared for discharge by ED provider; pt accomp by SO; voices good understanding of d/c and f/u; pt taken to discharge desk via w/c

## 2015-05-14 ENCOUNTER — Encounter: Payer: No Typology Code available for payment source | Attending: Surgery | Admitting: Surgery

## 2015-05-14 DIAGNOSIS — I890 Lymphedema, not elsewhere classified: Secondary | ICD-10-CM | POA: Diagnosis not present

## 2015-05-14 DIAGNOSIS — L97212 Non-pressure chronic ulcer of right calf with fat layer exposed: Secondary | ICD-10-CM | POA: Insufficient documentation

## 2015-05-14 DIAGNOSIS — I872 Venous insufficiency (chronic) (peripheral): Secondary | ICD-10-CM | POA: Diagnosis not present

## 2015-05-14 DIAGNOSIS — I87313 Chronic venous hypertension (idiopathic) with ulcer of bilateral lower extremity: Secondary | ICD-10-CM | POA: Diagnosis not present

## 2015-05-14 DIAGNOSIS — G4730 Sleep apnea, unspecified: Secondary | ICD-10-CM | POA: Insufficient documentation

## 2015-05-14 DIAGNOSIS — M069 Rheumatoid arthritis, unspecified: Secondary | ICD-10-CM | POA: Diagnosis not present

## 2015-05-14 DIAGNOSIS — L97222 Non-pressure chronic ulcer of left calf with fat layer exposed: Secondary | ICD-10-CM | POA: Insufficient documentation

## 2015-05-15 NOTE — Progress Notes (Signed)
[name] ([id number]) Visit Report for 05/14/2015 Chief Complaint Document Details Patient Name: [name], [name]. Date of Service: 05/14/2015 9:00 AM Medical Record Number: [id number] Patient Account Number: [id number] Date of Birth/Sex: 05/10/1971 (44 y.o. Male) Treating RN: Primary Care Physician: [name], [name] Other Clinician: Referring Physician: Mayford [name], [name] Treating Physician/Extender: [name] in Treatment: 0 Information Obtained from: Patient Chief Complaint Patient presents for treatment of an open ulcer due to venous insufficiency. 44 year old gentleman who has bilateral lower leg swelling and ulceration for the last 3 months. Electronic Signature(s) Signed: 05/14/2015 10:18:01 AM By: Evlyn Kanner MD, FACS Entered By: Evlyn Kanner on 05/14/2015 10:18:01 Biever, [name] ([id number]) -------------------------------------------------------------------------------- HPI Details Patient Name: Oscar Mirza C. Date of Service: 05/14/2015 9:00 AM Medical Record Number: [id number] Patient Account Number: [id number] Date of Birth/Sex: Apr 17, 1971 (44 y.o. Male) Treating RN: Primary Care Physician: [name], [name] Other Clinician: Referring Physician: TURNER, [name] Treating Physician/Extender: [name] in Treatment: 0 History of Present Illness Location: bilateral lower extremity swelling and ulceration Quality: Patient reports experiencing a dull pain to affected area(s). Severity: Patient states wound are getting worse. Duration: Patient has had the wound for > 3 months prior to seeking treatment at the wound center Timing: Pain in wound is Intermittent (comes and goes Context: The wound appeared gradually over time Modifying Factors: Other treatment(s) tried include: has seen his PCP and has been on Keflex. Associated Signs and Symptoms: Patient reports having difficulty standing for long periods. HPI Description: This 44 year old gentleman has  been referred from his PCPs office where he was seen by Oscar Sheets, PA. The patient is known to be seen most recently for morbidly obese, sleep apnea, chronic lower extremity edema, osteoarthritis, pain in the legs. His past medical history is significant for chronic venous insufficiency, pain in the back, morbid obesity, cardiomegaly, osteoarthritis of the hip, sleep apnea, kidney stones, hematuria, ulcers both lower extremities. He had a left hip arthroplasty in April of this year. He has never had venous duplex studies done on his lower extremities though he has been to the wound center several years ago. He has also not been wearing his compression stockings. Electronic Signature(s) Signed: 05/14/2015 10:19:11 AM By: Evlyn Kanner MD, FACS Previous Signature: 05/14/2015 9:44:09 AM Version By: Evlyn Kanner MD, FACS Previous Signature: 05/14/2015 9:37:49 AM Version By: Evlyn Kanner MD, FACS Entered By: Evlyn Kanner on 05/14/2015 10:19:11 Glaude, [name] ([id number]) -------------------------------------------------------------------------------- Physical Exam Details Patient Name: [name], [name] Date of Service: 05/14/2015 9:00 AM Medical Record Number: [id number] Patient Account Number: [id number] Date of Birth/Sex: 1971-08-05 (44 y.o. Male) Treating RN: Primary Care Physician: [name], [name] Other Clinician: Referring Physician: Mayford [name], [name] Treating Physician/Extender: [name] in Treatment: 0 Constitutional . Pulse regular. Respirations normal and unlabored. Afebrile. . Eyes Nonicteric. Reactive to light. Ears, Nose, Mouth, and Throat Lips, teeth, and gums WNL.Marland Kitchen Moist mucosa without lesions . Neck supple and nontender. No palpable supraclavicular or cervical adenopathy. Normal sized without goiter. Respiratory WNL. No retractions.. Breath sounds WNL, No rubs, rales, rhonchi, or wheeze.. Cardiovascular Pedal Pulses WNL. ABI on the left is 1.25) of the right is  1.11. he has bilateral lower extremity edema.. Gastrointestinal (GI) Abdomen without masses or tenderness.. No liver or spleen enlargement or tenderness.. Musculoskeletal Adexa without tenderness or enlargement.. Digits and nails w/o clubbing, cyanosis, infection, petechiae, ischemia, or inflammatory conditions.. Integumentary (Hair, Skin) No suspicious lesions. No crepitus or fluctuance. No peri-wound warmth or erythema. No masses.Marland Kitchen Psychiatric Judgement and insight Intact.. No evidence  of depression, anxiety, or agitation.. Notes He has significant ulceration on the right lateral lower extremity and also on the left lateral lower extremity. In some areas in the left lower extremity that hyperbaric granulation tissue. Minimal debris has been washed off with saline gauze. Electronic Signature(s) Signed: 05/14/2015 10:20:44 AM By: Evlyn Kanner MD, FACS Entered By: Evlyn Kanner on 05/14/2015 10:20:44 Moffa, [name] ([id number]) -------------------------------------------------------------------------------- Physician Orders Details Patient Name: Oscar Mirza C. Date of Service: 05/14/2015 9:00 AM Medical Record Number: [id number] Patient Account Number: [id number] Date of Birth/Sex: May 14, 1971 (44 y.o. Male) Treating RN: [name] Primary Care Physician: [name], [name] Other Clinician: Referring Physician: Mayford [name], [name] Treating Physician/Extender: [name] in Treatment: 0 Verbal / Phone Orders: Yes Clinician: Huel [name] Read Back and Verified: Yes Diagnosis Coding Wound Cleansing Wound #1 Right,Lateral Lower Leg o Clean wound with Normal Saline. Wound #2 Left,Lateral Lower Leg o Clean wound with Normal Saline. Wound #3 Left,Posterior Lower Leg o Clean wound with Normal Saline. Anesthetic Wound #1 Right,Lateral Lower Leg o Topical Lidocaine 4% cream applied to wound bed prior to debridement Wound #2 Left,Lateral Lower Leg o Topical Lidocaine 4% cream  applied to wound bed prior to debridement Wound #3 Left,Posterior Lower Leg o Topical Lidocaine 4% cream applied to wound bed prior to debridement Primary Wound Dressing Wound #1 Right,Lateral Lower Leg o Aquacel Ag Wound #2 Left,Lateral Lower Leg o Aquacel Ag Wound #3 Left,Posterior Lower Leg o Aquacel Ag Secondary Dressing Wound #1 Right,Lateral Lower Leg o ABD pad Wound #2 Left,Lateral Lower Leg o ABD pad [name], [name] ([id number]) Wound #3 Left,Posterior Lower Leg o ABD pad Dressing Change Frequency Wound #1 Right,Lateral Lower Leg o Change dressing every week Wound #2 Left,Lateral Lower Leg o Change dressing every week Wound #3 Left,Posterior Lower Leg o Change dressing every week Follow-up Appointments Wound #1 Right,Lateral Lower Leg o Return Appointment in 1 week. o Nurse Visit as needed - Thursday for [facility] Change Wound #2 Left,Lateral Lower Leg o Return Appointment in 1 week. o Nurse Visit as needed - Thursday for [facility] Change Wound #3 Left,Posterior Lower Leg o Return Appointment in 1 week. o Nurse Visit as needed - Thursday for [facility] Change Edema Control Wound #1 Right,Lateral Lower Leg o Unna Boots Bilaterally o Elevate legs to the level of the heart and pump ankles as often as possible Wound #2 Left,Lateral Lower Leg o Unna Boots Bilaterally o Elevate legs to the level of the heart and pump ankles as often as possible Wound #3 Left,Posterior Lower Leg o Unna Boots Bilaterally o Elevate legs to the level of the heart and pump ankles as often as possible Additional Orders / Instructions Wound #1 Right,Lateral Lower Leg o Increase protein intake. o OK to return to work o Activity as tolerated o Other: - C-pap to be used every night [name], [name] ([id number]) Wound #2 Left,Lateral Lower Leg o Increase protein intake. o OK to return to work o Activity as tolerated o  Other: - C-pap to be used every night Wound #3 Left,Posterior Lower Leg o Increase protein intake. o OK to return to work o Activity as tolerated o Other: - C-pap to be used every night Medications-please add to medication list. o P.O. Antibiotics - Complete Keflex Services and Therapies o Venous Duplex Doppler - [facility] referral oooo Electronic Signature(s) Signed: 05/14/2015 12:32:38 PM By: Evlyn Kanner MD, FACS Signed: 05/14/2015 5:24:22 PM By: Elliot Gurney, RN, BSN, Kim RN, BSN Entered By: Elliot Gurney, RN, BSN,  Kim on 05/14/2015 10:12:39 Barthold, Clem CMarland Kitchen ([id number]) -------------------------------------------------------------------------------- Problem List Details Patient Name: Mapp, Davarious C. Date of Service: 05/14/2015 9:00 AM Medical Record Number: [id number] Patient Account Number: [id number] Date of Birth/Sex: 06-24-1971 (44 y.o. 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 layer 05/14/2015 Yes exposed L97.222 Non-pressure chronic ulcer of left calf with fat layer 05/14/2015 Yes exposed E66.01 Morbid (severe) obesity due to excess calories 05/14/2015 Yes I89.0 Lymphedema, not elsewhere classified 05/14/2015 Yes Inactive Problems Resolved Problems Electronic Signature(s) Signed: 05/14/2015 10:17:28 AM By: Evlyn Kanner MD, FACS Previous Signature: 05/14/2015 10:17:15 AM Version By: Evlyn Kanner MD, FACS Entered By: Evlyn Kanner on 05/14/2015 10:17:28 Autry, [name] ([id number]) -------------------------------------------------------------------------------- Progress Note Details Patient Name: [name],  [name] Date of Service: 05/14/2015 9:00 AM Medical Record Number: [id number] Patient Account Number: [id number] Date of Birth/Sex: 02/04/1971 (44 y.o. Male) Treating RN: Primary Care Physician: [name], [name] Other Clinician: Referring Physician: Mayford [name], [name] Treating Physician/Extender: [name] in Treatment: 0 Subjective Chief Complaint Information obtained from Patient Patient presents for treatment of an open ulcer due to venous insufficiency. 44 year old gentleman who has bilateral lower leg swelling and ulceration for the last 3 months. History of Present Illness (HPI) The following HPI elements were documented for the patient's wound: Location: bilateral lower extremity swelling and ulceration Quality: Patient reports experiencing a dull pain to affected area(s). Severity: Patient states wound are getting worse. Duration: Patient has had the wound for > 3 months prior to seeking treatment at the wound center Timing: Pain in wound is Intermittent (comes and goes Context: The wound appeared gradually over time Modifying Factors: Other treatment(s) tried include: has seen his PCP and has been on Keflex. Associated Signs and Symptoms: Patient reports having difficulty standing for long periods. This 44 year old gentleman has been referred from his PCPs office where he was seen by Oscar Sheets, PA. The patient is known to be seen most recently for morbidly obese, sleep apnea, chronic lower extremity edema, osteoarthritis, pain in the legs. His past medical history is significant for chronic venous insufficiency, pain in the back, morbid obesity, cardiomegaly, osteoarthritis of the hip, sleep apnea, kidney stones, hematuria, ulcers both lower extremities. He had a left hip arthroplasty in April of this year. He has never had venous duplex studies done on his lower extremities though he has been to the wound center several years ago. He has also not been wearing his compression  stockings. Wound History Patient presents with 3 open wounds that have been present for approximately APRIL. Patient has been treating wounds in the following manner: COVERING WITH BANDAGE. Laboratory tests have been performed in the last month. Patient reportedly has not tested positive for an antibiotic resistant organism. Patient reportedly has not tested positive for osteomyelitis. Patient reportedly has not had testing performed to evaluate circulation in the legs. Patient experiences the following problems associated with their wounds: infection, swelling. Patient History Information obtained from Patient, Chart. [name], Oscar GARTRELL ([id number]) Allergies No Known Drug Allergies Family History Cancer - Mother, Diabetes - Mother, Heart Disease - Father, No family history of Hypertension, Kidney Disease, Lung Disease, Seizures, Stroke, Thyroid Problems. Social History Never smoker, Marital Status - Married, Alcohol Use - Rarely, Drug Use - No History, Caffeine  Use - Moderate. Medical History Eyes Denies history of Cataracts, Glaucoma, Optic Neuritis Ear/Nose/Mouth/Throat Denies history of Chronic sinus problems/congestion Hematologic/Lymphatic Denies history of Anemia, Hemophilia, Human Immunodeficiency Virus, Lymphedema, Sickle Cell Disease Respiratory Patient has history of Sleep Apnea - C-pap Denies history of Aspiration, Asthma, Chronic Obstructive Pulmonary Disease (COPD), Pneumothorax, Tuberculosis Cardiovascular Denies history of Angina, Arrhythmia, Congestive Heart Failure, Coronary Artery Disease, Deep Vein Thrombosis, Hypertension, Hypotension, Myocardial Infarction, Peripheral Arterial Disease, Peripheral Venous Disease, Phlebitis, Vasculitis Gastrointestinal Denies history of Cirrhosis , Colitis, Crohn s, Hepatitis A, Hepatitis B Endocrine Denies history of Type I Diabetes, Type II Diabetes Genitourinary Denies history of End Stage Renal  Disease Immunological Denies history of Lupus Erythematosus, Raynaud s, Scleroderma Integumentary (Skin) Denies history of History of Burn, History of pressure wounds Musculoskeletal Patient has history of Rheumatoid Arthritis Denies history of Gout, Osteoarthritis, Osteomyelitis Neurologic Denies history of Dementia, Neuropathy, Quadriplegia, Paraplegia, Seizure Disorder Oncologic Denies history of Received Chemotherapy, Received Radiation Psychiatric Denies history of Anorexia/bulimia, Confinement Anxiety Hospitalization/Surgery History - 02/13/2015, [facility], Hip replacement. Medical And Surgical History Notes Constitutional Symptoms (General Health) [name], [name] ([id number]) left Hip Surgery dt arthritis; leg Swelling; muscle spasms; Review of Systems (ROS) Constitutional Symptoms (General Health) Denies complaints or symptoms of Fatigue, Fever, Chills, Marked Weight Change. Eyes Denies complaints or symptoms of Dry Eyes, Vision Changes. Ear/Nose/Mouth/Throat Denies complaints or symptoms of Difficult clearing ears, Sinusitis. Hematologic/Lymphatic Denies complaints or symptoms of Bleeding / Clotting Disorders, Human Immunodeficiency Virus. Respiratory Denies complaints or symptoms of Chronic or frequent coughs, Shortness of Breath. Cardiovascular Complains or has symptoms of LE edema. Denies complaints or symptoms of Chest pain. Gastrointestinal Denies complaints or symptoms of Frequent diarrhea, Nausea, Vomiting. Endocrine Denies complaints or symptoms of Hepatitis, Thyroid disease, Polydypsia (Excessive Thirst). Genitourinary Denies complaints or symptoms of Kidney failure/ Dialysis, Incontinence/dribbling. Immunological Denies complaints or symptoms of Hives, Itching. Integumentary (Skin) Complains or has symptoms of Wounds. Denies complaints or symptoms of Bleeding or bruising tendency, Breakdown, Swelling. Musculoskeletal Denies complaints or symptoms  of Muscle Pain, Muscle Weakness. Neurologic Denies complaints or symptoms of Numbness/parasthesias, Focal/Weakness. Oncologic The patient has no complaints or symptoms. Psychiatric Denies complaints or symptoms of Anxiety, Claustrophobia. Medications aspirin 325 mg tablet oral 1 1 tablet oral once daily phentermine 37.5 mg capsule oral 1 1 capsule oral once daily Keflex 500 mg capsule oral 1 1 capsule oral three times daily Lasix 20 mg tablet oral 1 tablet oral once daily diclofenac ER 100 mg tablet,extended release 24 hr oral 1 1 tablet extended release 24 hr oral twice daily cyclobenzaprine 10 mg tablet oral 1 tablet oral every evening at bedtime [name], [name] ([id number]) Objective Constitutional Pulse regular. Respirations normal and unlabored. Afebrile. Vitals Time Taken: 9:20 AM, Height: 72 in, Source: Stated, Weight: 386 lbs, Source: Stated, BMI: 52.3, Temperature: 98.5 F, Pulse: 84 bpm, Respiratory Rate: 16 breaths/min, Blood Pressure: 143/66 mmHg. Eyes Nonicteric. Reactive to light. Ears, Nose, Mouth, and Throat Lips, teeth, and gums WNL.Marland Kitchen Moist mucosa without lesions . Neck supple and nontender. No palpable supraclavicular or cervical adenopathy. Normal sized without goiter. Respiratory WNL. No retractions.. Breath sounds WNL, No rubs, rales, rhonchi, or wheeze.. Cardiovascular Pedal Pulses WNL. ABI on the left is 1.25) of the right is 1.11. he has bilateral lower extremity edema.. Gastrointestinal (GI) Abdomen without masses or tenderness.. No liver or spleen enlargement or tenderness.. Musculoskeletal Adexa without tenderness or enlargement.. Digits and nails w/o clubbing, cyanosis, infection, petechiae, ischemia, or inflammatory conditions.Marland Kitchen Psychiatric Judgement and insight  Intact.. No evidence of depression, anxiety, or agitation.. General Notes: He has significant ulceration on the right lateral lower extremity and also on the left lateral lower extremity.  In some areas in the left lower extremity that hyperbaric granulation tissue. Minimal debris has been washed off with saline gauze. Integumentary (Hair, Skin) No suspicious lesions. No crepitus or fluctuance. No peri-wound warmth or erythema. No masses.. Wound #1 status is Open. Original cause of wound was Gradually Appeared. The wound is located on the Right,Lateral Lower Leg. The wound measures 3cm length x 4.2cm width x 0.1cm depth; 9.896cm^2 area and 0.99cm^3 volume. The wound is limited to skin breakdown. There is no tunneling or undermining noted. There is a large amount of serous drainage noted. The wound margin is indistinct and nonvisible. There is small (1-33%) pink granulation within the wound bed. There is a large (67-100%) amount of necrotic tissue within the wound bed including Adherent Slough. The periwound skin appearance exhibited: Maceration, Moist. Periwound temperature was noted as No Abnormality. The periwound has tenderness on palpation. Bertone, Jireh C. ([id number]) Wound #2 status is Open. Original cause of wound was Gradually Appeared. The wound is located on the Left,Lateral Lower Leg. The wound measures 7.4cm length x 5cm width x 0.1cm depth; 29.06cm^2 area and 2.906cm^3 volume. The wound is limited to skin breakdown. There is no tunneling or undermining noted. There is a large amount of serous drainage noted. The wound margin is indistinct and nonvisible. There is small (1-33%) red granulation within the wound bed. There is a medium (34-66%) amount of necrotic tissue within the wound bed including Adherent Slough. The periwound skin appearance exhibited: Moist. Wound #3 status is Open. Original cause of wound was Gradually Appeared. The wound is located on the Left,Posterior Lower Leg. The wound measures 0.5cm length x 0.5cm width x 0.1cm depth; 0.196cm^2 area and 0.02cm^3 volume. The wound is limited to skin breakdown. There is a small amount of serous drainage  noted. The wound margin is flat and intact. There is no granulation within the wound bed. There is a large (67-100%) amount of necrotic tissue within the wound bed including Adherent Slough. The periwound skin appearance did not exhibit: Callus, Crepitus, Excoriation, Fluctuance, Friable, Induration, Localized Edema, Rash, Scarring, Dry/Scaly, Maceration, Moist, Atrophie Blanche, Cyanosis, Ecchymosis, Hemosiderin Staining, Mottled, Pallor, Rubor, Erythema. Assessment Active Problems ICD-10 I87.313 - Chronic venous hypertension (idiopathic) with ulcer of bilateral lower extremity L97.212 - Non-pressure chronic ulcer of right calf with fat layer exposed L97.222 - Non-pressure chronic ulcer of left calf with fat layer exposed E66.01 - Morbid (severe) obesity due to excess calories I89.0 - Lymphedema, not elsewhere classified A 44 year old gentleman who is morbidly obese and has been diagnosed with venous hypertension but has never had any formal workup done with venous Doppler studies and we will get these ordered for him. Discussed with them at length the need for losing weight and also the fact that he has not been using his sleep apnea equipment at the night and has been sleeping in a recliner with his legs down. Recommended elevation of the limbs at all times when he is sedentary or sleeping and he says he will do this. I have recommended silver alginate and Unna's boot to both legs. All questions have been answered and he will come back and see as next week. [name], [name] ([id number]) Plan Wound Cleansing: Wound #1 Right,Lateral Lower Leg: Clean wound with Normal Saline. Wound #2 Left,Lateral Lower Leg: Clean wound with Normal Saline. Wound #  3 Left,Posterior Lower Leg: Clean wound with Normal Saline. Anesthetic: Wound #1 Right,Lateral Lower Leg: Topical Lidocaine 4% cream applied to wound bed prior to debridement Wound #2 Left,Lateral Lower Leg: Topical Lidocaine 4% cream  applied to wound bed prior to debridement Wound #3 Left,Posterior Lower Leg: Topical Lidocaine 4% cream applied to wound bed prior to debridement Primary Wound Dressing: Wound #1 Right,Lateral Lower Leg: Aquacel Ag Wound #2 Left,Lateral Lower Leg: Aquacel Ag Wound #3 Left,Posterior Lower Leg: Aquacel Ag Secondary Dressing: Wound #1 Right,Lateral Lower Leg: ABD pad Wound #2 Left,Lateral Lower Leg: ABD pad Wound #3 Left,Posterior Lower Leg: ABD pad Dressing Change Frequency: Wound #1 Right,Lateral Lower Leg: Change dressing every week Wound #2 Left,Lateral Lower Leg: Change dressing every week Wound #3 Left,Posterior Lower Leg: Change dressing every week Follow-up Appointments: Wound #1 Right,Lateral Lower Leg: Return Appointment in 1 week. Nurse Visit as needed - Thursday for [facility] Change Wound #2 Left,Lateral Lower Leg: Return Appointment in 1 week. Nurse Visit as needed - Thursday for [facility] Change Wound #3 Left,Posterior Lower Leg: Return Appointment in 1 week. Nurse Visit as needed - Thursday for [facility] [employer] Change Edema Control: Wound #1 Right,Lateral Lower Leg: [employer] Bilaterally [name], [name] ([id number]) Elevate legs to the level of the heart and pump ankles as often as possible Wound #2 Left,Lateral Lower Leg: Unna Boots Bilaterally Elevate legs to the level of the heart and pump ankles as often as possible Wound #3 Left,Posterior Lower Leg: Unna Boots Bilaterally Elevate legs to the level of the heart and pump ankles as often as possible Additional Orders / Instructions: Wound #1 Right,Lateral Lower Leg: Increase protein intake. OK to return to work Activity as tolerated Other: - C-pap to be used every night Wound #2 Left,Lateral Lower Leg: Increase protein intake. OK to return to work Activity as tolerated Other: - C-pap to be used every night Wound #3 Left,Posterior Lower Leg: Increase protein intake. OK to return to work Activity as  tolerated Other: - C-pap to be used every night Medications-please add to medication list.: P.O. Antibiotics - Complete Keflex Services and Therapies ordered were: Venous Duplex Doppler - [facility] referral A 44 year old gentleman who is morbidly obese and has been diagnosed with venous hypertension but has never had any formal workup done with venous Doppler studies and we will get these ordered for him. Discussed with them at length the need for losing weight and also the fact that he has not been using his sleep apnea equipment at the night and has been sleeping in a recliner with his legs down. Recommended elevation of the limbs at all times when he is sedentary or sleeping and he says he will do this. I have recommended silver alginate and Unna's boot to both legs. All questions have been answered and he will come back and see as next week. Electronic Signature(s) Signed: 05/14/2015 10:22:35 AM By: Evlyn Kanner MD, FACS [place], [name] ([id number]) Entered By: Evlyn Kanner on 05/14/2015 10:22:35 [place], [name] ([id number]) -------------------------------------------------------------------------------- ROS/PFSH Details Patient Name: Oscar Mirza C. Date of Service: 05/14/2015 9:00 AM Medical Record Number: [id number] Patient Account Number: [id number] Date of Birth/Sex: [birth year] (44 y.o. Male) Treating RN: [name] Primary Care Physician: [name], [name] Other Clinician: Referring Physician: Mayford [name], [name] Treating Physician/Extender: [name] in Treatment: 0 Information Obtained From Patient Chart Wound History Do you currently have one or more open woundso Yes How many open wounds do you currently haveo 3 Approximately how long have you had  your woundso APRIL How have you been treating your wound(s) until nowo COVERING WITH BANDAGE Has your wound(s) ever healed and then [name]-openedo No Have you had any lab work done in the past montho Yes Who ordered the lab  work doneo CX by PCP Have you tested positive for an antibiotic resistant organism (MRSA, No VRE)o Have you tested positive for osteomyelitis (bone infection)o No Have you had any tests for circulation on your legso No Have you had other problems associated with your woundso Infection, Swelling Constitutional Symptoms (General Health) Complaints and Symptoms: Negative for: Fatigue; Fever; Chills; Marked Weight Change Medical History: Past Medical History Notes: left Hip Surgery dt arthritis; leg Swelling; muscle spasms; Eyes Complaints and Symptoms: Negative for: Dry Eyes; Vision Changes Medical History: Negative for: Cataracts; Glaucoma; Optic Neuritis Ear/Nose/Mouth/Throat Complaints and Symptoms: Negative for: Difficult clearing ears; Sinusitis Medical History: Negative for: Chronic sinus problems/congestion Heckmann, Babatunde C. ([id number]) Hematologic/Lymphatic Complaints and Symptoms: Negative for: Bleeding / Clotting Disorders; Human Immunodeficiency Virus Medical History: Negative for: Anemia; Hemophilia; Human Immunodeficiency Virus; Lymphedema; Sickle Cell Disease Respiratory Complaints and Symptoms: Negative for: Chronic or frequent coughs; Shortness of Breath Medical History: Positive for: Sleep Apnea - C-pap Negative for: Aspiration; Asthma; Chronic Obstructive Pulmonary Disease (COPD); Pneumothorax; Tuberculosis Cardiovascular Complaints and Symptoms: Positive for: LE edema Negative for: Chest pain Medical History: Negative for: Angina; Arrhythmia; Congestive Heart Failure; Coronary Artery Disease; Deep Vein Thrombosis; Hypertension; Hypotension; Myocardial Infarction; Peripheral Arterial Disease; Peripheral Venous Disease; Phlebitis; Vasculitis Gastrointestinal Complaints and Symptoms: Negative for: Frequent diarrhea; Nausea; Vomiting Medical History: Negative for: Cirrhosis ; Colitis; Crohnos; Hepatitis A; Hepatitis B Endocrine Complaints and  Symptoms: Negative for: Hepatitis; Thyroid disease; Polydypsia (Excessive Thirst) Medical History: Negative for: Type I Diabetes; Type II Diabetes Genitourinary Complaints and Symptoms: Negative for: Kidney failure/ Dialysis; Incontinence/dribbling Medical History: [name], [name] ([id number]) Negative for: End Stage Renal Disease Immunological Complaints and Symptoms: Negative for: Hives; Itching Medical History: Negative for: Lupus Erythematosus; Raynaudos; Scleroderma Integumentary (Skin) Complaints and Symptoms: Positive for: Wounds Negative for: Bleeding or bruising tendency; Breakdown; Swelling Medical History: Negative for: History of Burn; History of pressure wounds Musculoskeletal Complaints and Symptoms: Negative for: Muscle Pain; Muscle Weakness Medical History: Positive for: Rheumatoid Arthritis Negative for: Gout; Osteoarthritis; Osteomyelitis Neurologic Complaints and Symptoms: Negative for: Numbness/parasthesias; Focal/Weakness Medical History: Negative for: Dementia; Neuropathy; Quadriplegia; Paraplegia; Seizure Disorder Psychiatric Complaints and Symptoms: Negative for: Anxiety; Claustrophobia Medical History: Negative for: Anorexia/bulimia; Confinement Anxiety Oncologic Complaints and Symptoms: No Complaints or Symptoms Medical History: Negative for: Received Chemotherapy; Received Radiation QAADIR, QUAM ([id number]) Hospitalization / Surgery History Name of Hospital Purpose of Hospitalization/Surgery Date [facility] Hip replacement 02/13/2015 Family and Social History Cancer: Yes - Mother; Diabetes: Yes - Mother; Heart Disease: Yes - Father; Hypertension: No; Kidney Disease: No; Lung Disease: No; Seizures: No; Stroke: No; Thyroid Problems: No; Never smoker; Marital Status - Married; Alcohol Use: Rarely; Drug Use: No History; Caffeine Use: Moderate; Living Will: No; Medical Power of Attorney: No Physician Affirmation I have reviewed  and agree with the above information. Electronic Signature(s) Signed: 05/14/2015 9:56:47 AM By: Elliot Gurney RN, BSN, Kim RN, BSN Signed: 05/14/2015 12:32:38 PM By: Evlyn Kanner MD, FACS Previous Signature: 05/14/2015 9:19:42 AM Version By: Evlyn Kanner MD, FACS Entered By: Elliot Gurney RN, BSN, Kim on 05/14/2015 09:56:47 [place], [name] ([id number]) -------------------------------------------------------------------------------- SuperBill Details Patient Name: Christenson, Dan C. Date of Service: 05/14/2015 Medical Record Number: [id number] Patient Account Number: [id number] Date of Birth/Sex: 1971/10/11 (44 y.o. 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 E66.01 Morbid (severe) obesity due to excess calories I89.0 Lymphedema, not elsewhere classified Facility Procedures CPT4 Code: 16109604 Description: 99213 - WOUND CARE VISIT-LEV 3 EST PT Modifier: Quantity: 1 CPT4 Code: 54098119 Description: 14782 - APPLY UNNA BOOT/PROFO BILATERAL Modifier: Quantity: 1 Physician Procedures CPT4: Description Modifier Quantity Code 9562130 99204 - WC PHYS LEVEL 4 - NEW PT 1 ICD-10 Description Diagnosis I87.313 Chronic venous hypertension (idiopathic) with ulcer of bilateral lower extremity L97.212 Non-pressure chronic ulcer of right calf  with fat layer exposed L97.222 Non-pressure chronic ulcer of left calf with fat layer exposed E66.01 Morbid (severe) obesity due to excess calories Electronic Signature(s) Signed: 05/14/2015 5:24:22 PM By: Elliot Gurney, RN, BSN, Kim RN, BSN Previous Signature: 05/14/2015 12:32:38  PM Version By: Evlyn Kanner MD, FACS Previous Signature: 05/14/2015 10:23:05 AM Version By: Evlyn Kanner MD, FACS Entered By: Elliot Gurney RN, BSN, Kim on 05/14/2015 16:35:54

## 2015-05-15 NOTE — Progress Notes (Signed)
MORY, HERRMAN ([id number]) Visit Report for 05/14/2015 Allergy List Details Patient Name: [name], [name]. Date of Service: 05/14/2015 9:00 AM Medical Record Number: [id number] Patient Account Number: [id number] Date of Birth/Sex: Apr 21, 1971 (44 y.o. Male) Treating RN: Huel Coventry Primary Care Physician: Mayford Knife, ERIC Other Clinician: Referring Physician: Mayford Knife, ERIC Treating Physician/Extender: Rudene Re in Treatment: 0 Allergies Active Allergies No Known Drug Allergies Allergy Notes Electronic Signature(s) Signed: 05/14/2015 5:24:22 PM By: Elliot Gurney, RN, BSN, Kim RN, BSN Entered By: Elliot Gurney, RN, BSN, Kim on 05/14/2015 09:48:49 [name], [name] ([id number]) -------------------------------------------------------------------------------- Arrival Information Details Patient Name: Oscar Mirza C. Date of Service: 05/14/2015 9:00 AM Medical Record Number: [id number] Patient Account Number: [id number] Date of Birth/Sex: Sep 09, 1971 (44 y.o. Male) Treating RN: Huel Coventry Primary Care Physician: Mayford Knife, ERIC Other Clinician: Referring Physician: Mayford Knife, ERIC Treating Physician/Extender: Rudene Re in Treatment: 0 Visit Information Patient Arrived: Ambulatory Arrival Time: 09:15 Accompanied By: wife Transfer Assistance: None Patient Identification Verified: Yes Secondary Verification Process Yes Completed: Patient Has Alerts: Yes Patient Alerts: Patient on Blood Thinner Electronic Signature(s) Signed: 05/14/2015 5:24:22 PM By: Elliot Gurney, RN, BSN, Kim RN, BSN Entered By: Elliot Gurney, RN, BSN, Kim on 05/14/2015 09:21:51 [name] -------------------------------------------------------------------------------- Clinic Level of Care Assessment Details Patient Name: [name], [name] Date of Service: 05/14/2015 9:00 AM Medical Record Number: [id number] Patient Account Number: [id number] Date of Birth/Sex: 05-20-1971 (44 y.o. Male) Treating RN: Huel Coventry Primary Care Physician: Mayford Knife, ERIC Other Clinician: Referring Physician: Mayford Knife, ERIC Treating Physician/Extender: Rudene Re in Treatment: 0 Clinic Level of Care Assessment Items TOOL 1 Quantity Score  - Use when EandM and Procedure is performed on INITIAL visit 0 ASSESSMENTS - Nursing Assessment / Reassessment  - General Physical Exam (combine w/ comprehensive assessment (listed just 0 below) when performed on new pt. evals) X - Comprehensive Assessment (HX, ROS, Risk Assessments, Wounds Hx, etc.) 1 25 ASSESSMENTS - Wound and Skin Assessment / Reassessment  - Dermatologic / Skin Assessment (not related to wound area) 0 ASSESSMENTS - Ostomy and/or Continence Assessment and Care  - Incontinence Assessment and Management 0  - Ostomy Care Assessment and Management (repouching, etc.) 0 PROCESS - Coordination of Care X - Simple Patient / Family Education for ongoing care 1 15  - Complex (extensive) Patient / Family Education for ongoing care 0 X - Staff obtains Chiropractor, Records, Test Results / Process Orders 1 10  - Staff telephones HHA, Nursing Homes / Clarify orders / etc 0  - Routine Transfer to another Facility (non-emergent condition) 0  - Routine Hospital Admission (non-emergent condition) 0 X - New Admissions / Manufacturing engineer / Ordering NPWT, Apligraf, etc. 1 15  - Emergency Hospital Admission (emergent condition) 0 PROCESS - Special Needs  - Pediatric / Minor Patient Management 0  - Isolation Patient Management 0 [name], [name] ([id number])  - Hearing / Language / Visual special needs 0  - Assessment of Community assistance (transportation, D/C planning, etc.) 0  - Additional assistance / Altered mentation 0  - Support Surface(s) Assessment (bed, cushion, seat, etc.) 0 INTERVENTIONS - Miscellaneous  - External ear exam 0  - Patient Transfer (multiple staff / Nurse, adult / Similar devices) 0  - Simple Staple  / Suture removal (25 or less) 0  - Complex Staple / Suture removal (26 or more) 0  - Hypo/Hyperglycemic Management (do not check if billed separately) 0 X - Ankle / Brachial Index (ABI) - do not check if billed separately 1 15 Has the  patient been seen at the hospital within the last three years: Yes Total Score: 80 Level Of Care: New/Established - Level 3 Electronic Signature(s) Signed: 05/14/2015 5:24:22 PM By: Elliot Gurney, RN, BSN, Kim RN, BSN Entered By: Elliot Gurney, RN, BSN, Kim on 05/14/2015 10:30:28 [name], [name] ([id number]) -------------------------------------------------------------------------------- Encounter Discharge Information Details Patient Name: Oscar Mirza C. Date of Service: 05/14/2015 9:00 AM Medical Record Number: [id number] Patient Account Number: [id number] Date of Birth/Sex: [birth year] (44 y.o. Male) Treating RN: Huel Coventry Primary Care Physician: Mayford Knife, ERIC Other Clinician: Referring Physician: Mayford Knife, ERIC Treating Physician/Extender: Rudene Re in Treatment: 0 Encounter Discharge Information Items Discharge Pain Level: 0 Discharge Condition: Stable Ambulatory Status: Ambulatory Discharge Destination: Home Private Transportation: Auto Accompanied By: wife Schedule Follow-up Appointment: Yes Medication Reconciliation completed and Yes provided to Patient/Care [name]: Clinical Summary of Care: Electronic Signature(s) Signed: 05/14/2015 5:24:22 PM By: Elliot Gurney, RN, BSN, Kim RN, BSN Entered By: Elliot Gurney, RN, BSN, Kim on 05/14/2015 10:32:02 [name], [name] ([id number]) -------------------------------------------------------------------------------- Lower Extremity Assessment Details Patient Name: [name], [name] Date of Service: 05/14/2015 9:00 AM Medical Record Number: [id number] Patient Account Number: [id number] Date of Birth/Sex: 1970/11/17 (44 y.o. Male) Treating RN: Huel Coventry Primary Care Physician: Mayford Knife, ERIC Other  Clinician: Referring Physician: Mayford Knife, ERIC Treating Physician/Extender: Rudene Re in Treatment: 0 Edema Assessment Assessed: [Left: No] [Right: No] E[Left: dema] [Right: :] Calf Left: Right: Point of Measurement: 33 cm From Medial Instep 51 cm 52 cm Ankle Left: Right: Point of Measurement: 11 cm From Medial Instep 32.2 cm 31 cm Vascular Assessment Pulses: Posterior Tibial Palpable: [Left:No] [Right:No] Dorsalis Pedis Palpable: [Left:Yes] [Right:Yes] Extremity colors, hair growth, and conditions: Extremity Color: [Left:Hyperpigmented] [Right:Hyperpigmented] Hair Growth on Extremity: [Left:Yes] [Right:Yes] Temperature of Extremity: [Left:Warm] [Right:Warm] Capillary Refill: [Left:< 3 seconds] [Right:< 3 seconds] Blood Pressure: Brachial: [Left:132] [Right:150] Dorsalis Pedis: 150 [Left:Dorsalis Pedis: 166] Ankle: Posterior Tibial: 188 [Left:Posterior Tibial: 132 1.25] [Right:1.11] Toe Nail Assessment Left: Right: Thick: Yes Yes Discolored: Yes Yes Deformed: Yes Yes Improper Length and Hygiene: Yes Yes Electronic Signature(s) MERRIK, HAYMAN ([id number]) Signed: 05/14/2015 5:24:22 PM By: Elliot Gurney, RN, BSN, Kim RN, BSN Entered By: Elliot Gurney, RN, BSN, Kim on 05/14/2015 09:42:31 Stencil, [name] ([id number]) -------------------------------------------------------------------------------- Multi Wound Chart Details Patient Name: Grega, Oswin C. Date of Service: 05/14/2015 9:00 AM Medical Record Number: [id number] Patient Account Number: [id number] Date of Birth/Sex: 22-Jun-1971 (44 y.o. Male) Treating RN: Huel Coventry Primary Care Physician: Mayford Knife, ERIC Other Clinician: Referring Physician: Mayford Knife, ERIC Treating Physician/Extender: Rudene Re in Treatment: 0 Vital Signs Height(in): 72 Pulse(bpm): 84 Weight(lbs): 386 Blood Pressure 143/66 (mmHg): Body Mass Index(BMI): 52 Temperature(F): 98.5 Respiratory Rate 16 (breaths/min): Photos: [1:No  Photos] [2:No Photos] [3:No Photos] Wound Location: [1:Right Lower Leg - Lateral] [2:Left Lower Leg - Lateral] [3:Left Lower Leg - Posterior] Wounding Event: [1:Gradually Appeared] [2:Gradually Appeared] [3:Gradually Appeared] Primary Etiology: [1:Venous Leg Ulcer] [2:Venous Leg Ulcer] [3:Venous Leg Ulcer] Date Acquired: [1:01/26/2015] [2:01/26/2015] [3:01/26/2015] Weeks of Treatment: [1:0] [2:0] [3:0] Wound Status: [1:Open] [2:Open] [3:Open] Measurements L x W x D 3x4.2x0.1 [2:7.4x5x0.1] [3:0.5x0.5x0.1] (cm) Area (cm) : [1:9.896] [2:29.06] [3:0.196] Volume (cm) : [1:0.99] [2:2.906] [3:0.02] % Reduction in Area: [1:0.00%] [2:0.00%] [3:0.00%] % Reduction in Volume: 0.00% [2:0.00%] [3:0.00%] Classification: [1:Full Thickness Without Exposed Support Structures] [2:Partial Thickness] [3:Partial Thickness] Exudate Amount: [1:Large] [2:Large] [3:Small] Exudate Type: [1:Serous] [2:Serous] [3:Serous] Exudate Color: [1:amber] [2:amber] [3:amber] Wound Margin: [1:Indistinct, nonvisible] [2:Indistinct, nonvisible] [3:Flat and Intact] Granulation Amount: [1:Small (1-33%)] [2:Small (1-33%)] [3:None Present (0%)] Granulation Quality: [1:Pink, Hyper-granulation] [2:Red, Hyper-granulation] [3:N/A] Necrotic  Amount: [1:Large (67-100%)] [2:Medium (34-66%)] [3:Large (67-100%)] Exposed Structures: [1:Fascia: No Fat: No Tendon: No Muscle: No Joint: No Bone: No] [2:Fascia: No Fat: No Tendon: No Muscle: No Joint: No Bone: No] [3:Fascia: No Fat: No Tendon: No Muscle: No Joint: No Bone: No] Limited to Skin Limited to Skin Limited to Skin Breakdown Breakdown Breakdown Epithelialization: None None N/A Periwound Skin Texture: No Abnormalities Noted No Abnormalities Noted Edema: No Excoriation: No Induration: No Callus: No Crepitus: No Fluctuance: No Friable: No Rash: No Scarring: No Periwound Skin Maceration: Yes Moist: Yes Maceration: No Moisture: Moist: Yes Moist: No Dry/Scaly: No Periwound Skin Color:  No Abnormalities Noted No Abnormalities Noted Atrophie Blanche: No Cyanosis: No Ecchymosis: No Erythema: No Hemosiderin Staining: No Mottled: No Pallor: No Rubor: No Temperature: No Abnormality N/A N/A Tenderness on Yes No No Palpation: Wound Preparation: Ulcer Cleansing: Ulcer Cleansing: Ulcer Cleansing: Rinsed/Irrigated with Rinsed/Irrigated with Rinsed/Irrigated with Saline Saline Saline Topical Anesthetic Topical Anesthetic Topical Anesthetic Applied: Other: lidociane Applied: Other: lidoCAINE Applied: Other: 4% 4% LIDOCAINE 4% Treatment Notes Electronic Signature(s) Signed: 05/14/2015 5:24:22 PM By: Elliot Gurney, RN, BSN, Kim RN, BSN Entered By: Elliot Gurney, RN, BSN, Kim on 05/14/2015 10:07:38 [name], [name] ([id number]) -------------------------------------------------------------------------------- Multi-Disciplinary Care Plan Details Patient Name: [name], [name] Date of Service: 05/14/2015 9:00 AM Medical Record Number: [id number] Patient Account Number: [id number] Date of Birth/Sex: 1971-01-22 (44 y.o. Male) Treating RN: Huel Coventry Primary Care Physician: Mayford Knife, ERIC Other Clinician: Referring Physician: Mayford Knife, ERIC Treating Physician/Extender: Rudene Re in Treatment: 0 Active Inactive Orientation to the Wound Care Program Nursing Diagnoses: Knowledge deficit related to the wound healing center program Goals: Patient/caregiver will verbalize understanding of the Wound Healing Center Program Date Initiated: 05/14/2015 Goal Status: Active Interventions: Provide education on orientation to the wound center Notes: Venous Leg Ulcer Nursing Diagnoses: Potential for venous Insuffiency (use before diagnosis confirmed) Goals: Non-invasive venous studies are completed as ordered Date Initiated: 05/14/2015 Goal Status: Active Interventions: Assess peripheral edema status every visit. Treatment Activities: Non-invasive vascular studies :  05/14/2015 Notes: Wound/Skin Impairment Nursing Diagnoses: Impaired tissue integrity [name], [name] ([id number]) Goals: Patient/caregiver will verbalize understanding of skin care regimen Date Initiated: 05/14/2015 Goal Status: Active Ulcer/skin breakdown will heal within 14 weeks Date Initiated: 05/14/2015 Goal Status: Active Interventions: Assess patient/caregiver ability to obtain necessary supplies Notes: Electronic Signature(s) Signed: 05/14/2015 5:24:22 PM By: Elliot Gurney, RN, BSN, Kim RN, BSN Entered By: Elliot Gurney, RN, BSN, Kim on 05/14/2015 10:07:26 [name], [name] ([id number]) -------------------------------------------------------------------------------- Pain Assessment Details Patient Name: [name], [name] Date of Service: 05/14/2015 9:00 AM Medical Record Number: [id number] Patient Account Number: [id number] Date of Birth/Sex: Feb 22, 1971 (44 y.o. Male) Treating RN: Huel Coventry Primary Care Physician: Mayford Knife, ERIC Other Clinician: Referring Physician: Mayford Knife, ERIC Treating Physician/Extender: Rudene Re in Treatment: 0 Active Problems Location of Pain Severity and Description of Pain Patient Has Paino No Site Locations Pain Management and Medication Current Pain Management: Notes Right hip pain Electronic Signature(s) Signed: 05/14/2015 5:24:22 PM By: Elliot Gurney, RN, BSN, Kim RN, BSN Entered By: Elliot Gurney, RN, BSN, Kim on 05/14/2015 09:22:13 [name], [name] ([id number]) -------------------------------------------------------------------------------- Patient/Caregiver Education Details Patient Name: [name]. Date of Service: 05/14/2015 9:00 AM Medical Record Number: [id number] Patient Account Number: [id number] Date of Birth/Gender: September 05, 1971 (44 y.o. Male) Treating RN: Huel Coventry Primary Care Physician: Mayford Knife, ERIC Other Clinician: Referring Physician: Mayford Knife, ERIC Treating Physician/Extender: Rudene Re in Treatment: 0 Education  Assessment Education Provided To: Patient Education Topics Provided Venous: Handouts: Controlling Swelling with Compression Stockings Methods: Demonstration,  Explain/Verbal Responses: State content correctly Electronic Signature(s) Signed: 05/14/2015 5:24:22 PM By: Elliot Gurney, RN, BSN, Kim RN, BSN Entered By: Elliot Gurney, RN, BSN, Kim on 05/14/2015 10:32:35 Marandola, [name] ([id number]) -------------------------------------------------------------------------------- Wound Assessment Details Patient Name: [name], [name] Date of Service: 05/14/2015 9:00 AM Medical Record Number: [id number] Patient Account Number: [id number] Date of Birth/Sex: [birth year] (44 y.o. Male) Treating RN: Huel Coventry Primary Care Physician: Mayford Knife, ERIC Other Clinician: Referring Physician: Mayford Knife, ERIC Treating Physician/Extender: Rudene Re in Treatment: 0 Wound Status Wound Number: 1 Primary Etiology: Venous Leg Ulcer Wound Location: Right Lower Leg - Lateral Wound Status: Open Wounding Event: Gradually Appeared Date Acquired: 01/26/2015 Weeks Of Treatment: 0 Clustered Wound: No Photos Photo Uploaded By: Elliot Gurney, RN, BSN, Kim on 05/14/2015 16:55:45 Wound Measurements Length: (cm) 3 Width: (cm) 4.2 Depth: (cm) 0.1 Area: (cm) 9.896 Volume: (cm) 0.99 % Reduction in Area: 0% % Reduction in Volume: 0% Epithelialization: None Tunneling: No Undermining: No Wound Description Full Thickness Without Exposed Classification: Support Structures Wound Margin: Indistinct, nonvisible Exudate Large Amount: Exudate Type: Serous Exudate Color: amber Wound Bed Granulation Amount: Small (1-33%) Exposed Structure Granulation Quality: Pink, Hyper-granulation Fascia Exposed: No Necrotic Amount: Large (67-100%) Fat Layer Exposed: No Bogacz, Arleigh C. ([id number]) Necrotic Quality: Adherent Slough Tendon Exposed: No Muscle Exposed: No Joint Exposed: No Bone Exposed: No Limited to Skin  Breakdown Periwound Skin Texture Texture Color No Abnormalities Noted: No No Abnormalities Noted: No Moisture Temperature / Pain No Abnormalities Noted: No Temperature: No Abnormality Maceration: Yes Tenderness on Palpation: Yes Moist: Yes Wound Preparation Ulcer Cleansing: Rinsed/Irrigated with Saline Topical Anesthetic Applied: Other: lidociane 4%, Treatment Notes Wound #1 (Right, Lateral Lower Leg) 1. Cleansed with: Clean wound with Normal Saline 2. Anesthetic Topical Lidocaine 4% cream to wound bed prior to debridement 4. Dressing Applied: Aquacel Ag 5. Secondary Dressing Applied ABD Pad 7. Secured with [employer] Bil[employer] Notes Armed forces operational officer) Signed: 05/14/2015 5:24:22 PM By: Elliot Gurney, RN, BSN, Kim RN, BSN Entered By: Elliot Gurney, RN, BSN, Kim on 05/14/2015 09:45:11 [name], [name] ([id number]) -------------------------------------------------------------------------------- Wound Assessment Details Patient Name: Talton, Raysean C. Date of Service: 05/14/2015 9:00 AM Medical Record Number: [id number] Patient Account Number: [id number] Date of Birth/Sex: 03/31/1971 (44 y.o. Male) Treating RN: Huel Coventry Primary Care Physician: Mayford Knife, ERIC Other Clinician: Referring Physician: Mayford Knife, ERIC Treating Physician/Extender: Rudene Re in Treatment: 0 Wound Status Wound Number: 2 Primary Etiology: Venous Leg Ulcer Wound Location: Left Lower Leg - Lateral Wound Status: Open Wounding Event: Gradually Appeared Date Acquired: 01/26/2015 Weeks Of Treatment: 0 Clustered Wound: No Photos Photo Uploaded By: Elliot Gurney, RN, BSN, Kim on 05/14/2015 16:56:25 Wound Measurements Length: (cm) 7.4 Width: (cm) 5 Depth: (cm) 0.1 Area: (cm) 29.06 Volume: (cm) 2.906 % Reduction in Area: 0% % Reduction in Volume: 0% Epithelialization: None Tunneling: No Undermining: No Wound Description Classification: Partial Thickness Wound Margin: Indistinct,  nonvisible Exudate Amount: Large Exudate Type: Serous Exudate Color: amber [name], [name] C. ([id number]) Wound Bed Granulation Amount: Small (1-33%) Exposed Structure Granulation Quality: Red, Hyper-granulation Fascia Exposed: No Necrotic Amount: Medium (34-66%) Fat Layer Exposed: No Necrotic Quality: Adherent Slough Tendon Exposed: No Muscle Exposed: No Joint Exposed: No Bone Exposed: No Limited to Skin Breakdown Periwound Skin Texture Texture Color No Abnormalities Noted: No No Abnormalities Noted: No Moisture No Abnormalities Noted: No Moist: Yes Wound Preparation Ulcer Cleansing: Rinsed/Irrigated with Saline Topical Anesthetic Applied: Other: lidoCAINE 4%, Treatment Notes Wound #2 (Left, Lateral Lower Leg) 1. Cleansed with: Clean wound with Normal Saline 2. Anesthetic Topical Lidocaine  4% cream to wound bed prior to debridement 4. Dressing Applied: Aquacel Ag 5. Secondary Dressing Applied ABD Pad 7. Secured with [employer] Notes Armed forces operational officer) Signed: 05/14/2015 5:24:22 PM By: Elliot Gurney, RN, BSN, Kim RN, BSN Entered By: Elliot Gurney, RN, BSN, Kim on 05/14/2015 09:46:58 Caraway, [name] ([id number]) -------------------------------------------------------------------------------- Wound Assessment Details Patient Name: Simien, Sylvain C. Date of Service: 05/14/2015 9:00 AM Medical Record Number: [id number] Patient Account Number: [id number] Date of Birth/Sex: 05-May-1971 (44 y.o. Male) Treating RN: Huel Coventry Primary Care Physician: Mayford Knife, ERIC Other Clinician: Referring Physician: Mayford Knife, ERIC Treating Physician/Extender: Rudene Re in Treatment: 0 Wound Status Wound Number: 3 Primary Etiology: Venous Leg Ulcer Wound Location: Left Lower Leg - Posterior Wound Status: Open Wounding Event: Gradually Appeared Date Acquired: 01/26/2015 Weeks Of Treatment: 0 Clustered Wound: No Photos Photo Uploaded By: Elliot Gurney, RN, BSN, Kim on  05/14/2015 16:57:12 Wound Measurements Length: (cm) 0.5 Width: (cm) 0.5 Depth: (cm) 0.1 Area: (cm) 0.196 Volume: (cm) 0.02 % Reduction in Area: 0% % Reduction in Volume: 0% Wound Description Classification: Partial Thickness Wound Margin: Flat and Intact Exudate Amount: Small Exudate Type: Serous Exudate Color: amber Wound Bed Granulation Amount: None Present (0%) Exposed Structure Necrotic Amount: Large (67-100%) Fascia Exposed: No Necrotic Quality: Adherent Slough Fat Layer Exposed: No Tendon Exposed: No Geoffroy, Wylder C. ([id number]) Muscle Exposed: No Joint Exposed: No Bone Exposed: No Limited to Skin Breakdown Periwound Skin Texture Texture Color No Abnormalities Noted: No No Abnormalities Noted: No Callus: No Atrophie Blanche: No Crepitus: No Cyanosis: No Excoriation: No Ecchymosis: No Fluctuance: No Erythema: No Friable: No Hemosiderin Staining: No Induration: No Mottled: No Localized Edema: No Pallor: No Rash: No Rubor: No Scarring: No Moisture No Abnormalities Noted: No Dry / Scaly: No Maceration: No Moist: No Wound Preparation Ulcer Cleansing: Rinsed/Irrigated with Saline Topical Anesthetic Applied: Other: LIDOCAINE 4%, Treatment Notes Wound #3 (Left, Posterior Lower Leg) 1. Cleansed with: Clean wound with Normal Saline 2. Anesthetic Topical Lidocaine 4% cream to wound bed prior to debridement 4. Dressing Applied: Aquacel Ag 5. Secondary Dressing Applied ABD Pad 7. Secured with [employer] Notes Armed forces operational officer) Signed: 05/14/2015 5:24:22 PM By: Elliot Gurney, RN, BSN, Kim RN, BSN Entered By: Elliot Gurney, RN, BSN, Kim on 05/14/2015 09:48:24 Matheson, [name] ([id number]) Vantassel, [name] -------------------------------------------------------------------------------- Vitals Details Patient Name: Oscar Mirza C. Date of Service: 05/14/2015 9:00 AM Medical Record Number: [id number] Patient  Account Number: [id number] Date of Birth/Sex: 10-Feb-1971 (44 y.o. Male) Treating RN: Huel Coventry Primary Care Physician: Mayford Knife, ERIC Other Clinician: Referring Physician: Mayford Knife, ERIC Treating Physician/Extender: Rudene Re in Treatment: 0 Vital Signs Time Taken: 09:20 Temperature (F): 98.5 Height (in): 72 Pulse (bpm): 84 Source: Stated Respiratory Rate (breaths/min): 16 Weight (lbs): 386 Blood Pressure (mmHg): 143/66 Source: Stated Reference Range: 80 - 120 mg / dl Body Mass Index (BMI): 52.3 Electronic Signature(s) Signed: 05/14/2015 5:24:22 PM By: Elliot Gurney, RN, BSN, Kim RN, BSN Entered By: Elliot Gurney, RN, BSN, Kim on 05/14/2015 09:23:15

## 2015-05-15 NOTE — Progress Notes (Signed)
[name], Oscar ([id number]) Visit Report for 05/14/2015 Abuse/Suicide Risk Screen Details Patient Name: [name], [name]. Date of Service: 05/14/2015 9:00 AM Medical Record Number: [id number] Patient Account Number: [id number] Date of Birth/Sex: 12-28-1970 (44 y.o. Male) Treating RN: Huel Coventry Primary Care Physician: Mayford Knife, ERIC Other Clinician: Referring Physician: Mayford Knife, ERIC Treating Physician/Extender: Rudene Re in Treatment: 0 Abuse/Suicide Risk Screen Items Answer ABUSE/SUICIDE RISK SCREEN: Has anyone close to you tried to hurt or harm you recentlyo No Do you feel uncomfortable with anyone in your familyo No Has anyone forced you do things that you didnot want to doo No Do you have any thoughts of harming yourselfo No Patient displays signs or symptoms of abuse and/or neglect. No Electronic Signature(s) Signed: 05/14/2015 5:24:22 PM By: Elliot Gurney, RN, BSN, Kim RN, BSN Entered By: Elliot Gurney, RN, BSN, Kim on 05/14/2015 09:57:05 Flemister, [name] ([id number]) -------------------------------------------------------------------------------- Activities of Daily Living Details Patient Name: Rachels, [name] Date of Service: 05/14/2015 9:00 AM Medical Record Number: [id number] Patient Account Number: [id number] Date of Birth/Sex: December 12, 1970 (44 y.o. Male) Treating RN: Huel Coventry Primary Care Physician: Mayford Knife, ERIC Other Clinician: Referring Physician: Mayford Knife, ERIC Treating Physician/Extender: Rudene Re in Treatment: 0 Activities of Daily Living Items Answer Activities of Daily Living (Please select one for each item) Drive Automobile Completely Able Take Medications Completely Able Use Telephone Completely Able Care for Appearance Completely Able Use Toilet Completely Able Bath / Shower Completely Able Dress Self Completely Able Feed Self Completely Able Walk Completely Able Get In / Out Bed Completely Able Housework Completely Able Prepare Meals  Completely Able Handle Money Completely Able Shop for Self Completely Able Electronic Signature(s) Signed: 05/14/2015 5:24:22 PM By: Elliot Gurney, RN, BSN, Kim RN, BSN Entered By: Elliot Gurney, RN, BSN, Kim on 05/14/2015 09:57:16 Mcnerney, [name] ([id number]) -------------------------------------------------------------------------------- Education Assessment Details Patient Name: Oscar Mirza C. Date of Service: 05/14/2015 9:00 AM Medical Record Number: [id number] Patient Account Number: [id number] Date of Birth/Sex: Mar 14, 1971 (44 y.o. Male) Treating RN: Huel Coventry Primary Care Physician: Mayford Knife, ERIC Other Clinician: Referring Physician: Mayford Knife, ERIC Treating Physician/Extender: Rudene Re in Treatment: 0 Primary Learner Assessed: Patient Learning Preferences/Education Level/Primary Language Learning Preference: Explanation, Demonstration Highest Education Level: College or Above Preferred Language: English Cognitive Barrier Assessment/Beliefs Language Barrier: No Translator Needed: No Memory Deficit: No Emotional Barrier: No Cultural/Religious Beliefs Affecting Medical No Care: Physical Barrier Assessment Impaired Vision: No Impaired Hearing: No Knowledge/Comprehension Assessment Knowledge Level: High Comprehension Level: High Ability to understand written High instructions: Ability to understand verbal High instructions: Motivation Assessment Anxiety Level: Calm Cooperation: Cooperative Education Importance: Acknowledges Need Interest in Health Problems: Asks Questions Perception: Coherent Willingness to Engage in Self- High Management Activities: Readiness to Engage in Self- High Management Activities: Electronic Signature(s) Signed: 05/14/2015 5:24:22 PM By: Elliot Gurney, RN, BSN, Kim RN, BSN [address], [name] ([id number]) Entered By: Elliot Gurney, RN, BSN, Kim on 05/14/2015 09:57:53 Montee, [name]  ([id number]) -------------------------------------------------------------------------------- Fall Risk Assessment Details Patient Name: Oscar Civil. Date of Service: 05/14/2015 9:00 AM Medical Record Number: [id number] Patient Account Number: [id number] Date of Birth/Sex: 22-May-1971 (44 y.o. Male) Treating RN: Huel Coventry Primary Care Physician: Mayford Knife, ERIC Other Clinician: Referring Physician: Mayford Knife, ERIC Treating Physician/Extender: Rudene Re in Treatment: 0 Fall Risk Assessment Items FALL RISK ASSESSMENT: History of falling - immediate or within 3 months 0 No Secondary diagnosis 0 No Ambulatory aid None/bed rest/wheelchair/nurse 0 Yes Crutches/cane/walker 0 No Furniture 0 No IV Access/Saline Lock 0 No Gait/Training Normal/bed rest/immobile 0 Yes Weak 0 No Impaired  0 No Mental Status Oriented to own ability 0 Yes Electronic Signature(s) Signed: 05/14/2015 5:24:22 PM By: Elliot Gurney, RN, BSN, Kim RN, BSN Entered By: Elliot Gurney, RN, BSN, Kim on 05/14/2015 09:58:03 Nishida, [name] ([id number]) -------------------------------------------------------------------------------- Foot Assessment Details Patient Name: [name], [name] Date of Service: 05/14/2015 9:00 AM Medical Record Number: [id number] Patient Account Number: [id number] Date of Birth/Sex: July 20, 1971 (44 y.o. Male) Treating RN: Huel Coventry Primary Care Physician: Mayford Knife, ERIC Other Clinician: Referring Physician: Mayford Knife, ERIC Treating Physician/Extender: Rudene Re in Treatment: 0 Foot Assessment Items Site Locations + = Sensation present, - = Sensation absent, C = Callus, U = Ulcer R = Redness, W = Warmth, M = Maceration, PU = Pre-ulcerative lesion F = Fissure, S = Swelling, D = Dryness Assessment Right: Left: Other Deformity: No No Prior Foot Ulcer: No No Prior Amputation: No No Charcot Joint: No No Ambulatory Status: Gait: Electronic Signature(s) Signed: 05/14/2015 5:24:22 PM By: Elliot Gurney,  RN, BSN, Kim RN, BSN Entered By: Elliot Gurney, RN, BSN, Kim on 05/14/2015 09:58:22 Sturgell, [name] ([id number]) -------------------------------------------------------------------------------- Nutrition Risk Assessment Details Patient Name: Roulhac, Eulan C. Date of Service: 05/14/2015 9:00 AM Medical Record Number: [id number] Patient Account Number: [id number] Date of Birth/Sex: Jan 08, 1971 (44 y.o. Male) Treating RN: Huel Coventry Primary Care Physician: Mayford Knife, ERIC Other Clinician: Referring Physician: Mayford Knife, ERIC Treating Physician/Extender: Rudene Re in Treatment: 0 Height (in): 72 Weight (lbs): 386 Body Mass Index (BMI): 52.3 Nutrition Risk Assessment Items NUTRITION RISK SCREEN: I have an illness or condition that made me change the kind and/or 0 No amount of food I eat I eat fewer than two meals per day 0 No I eat few fruits and vegetables, or milk products 0 No I have three or more drinks of beer, liquor or wine almost every day 0 No I have tooth or mouth problems that make it hard for me to eat 0 No I don't always have enough money to buy the food I need 0 No I eat alone most of the time 0 No I take three or more different prescribed or over-the-counter drugs a 0 No day Without wanting to, I have lost or gained 10 pounds in the last six 0 No months I am not always physically able to shop, cook and/or feed myself 0 No Nutrition Protocols Good Risk Protocol 0 No interventions needed Moderate Risk Protocol Electronic Signature(s) Signed: 05/14/2015 5:24:22 PM By: Elliot Gurney, RN, BSN, Kim RN, BSN Entered By: Elliot Gurney, RN, BSN, Kim on 05/14/2015 09:58:12

## 2015-05-17 ENCOUNTER — Ambulatory Visit: Payer: No Typology Code available for payment source

## 2015-05-21 ENCOUNTER — Encounter: Payer: No Typology Code available for payment source | Admitting: Surgery

## 2015-05-21 DIAGNOSIS — L97212 Non-pressure chronic ulcer of right calf with fat layer exposed: Secondary | ICD-10-CM | POA: Diagnosis not present

## 2015-05-22 NOTE — Progress Notes (Signed)
Oscar, [name] ([id number]) Visit Report for 05/21/2015 Chief Complaint Document Details Patient Name: [name], [name]. Date of Service: 05/21/2015 8:45 AM Medical Record Number: [id number] Patient Account Number: [id number] Date of Birth/Sex: Feb 27, 1971 (44 y.o. Male) Treating RN: Primary Care Physician: Mayford Knife, ERIC Other Clinician: Referring Physician: Mayford Knife, ERIC Treating Physician/Extender: Rudene Re in Treatment: 1 Information Obtained from: Patient Chief Complaint Patient presents for treatment of an open ulcer due to venous insufficiency. 44 year old gentleman who has bilateral lower leg swelling and ulceration for the last 3 months. Electronic Signature(s) Signed: 05/21/2015 9:20:50 AM By: Evlyn Kanner MD, FACS Entered By: Evlyn Kanner on 05/21/2015 09:20:50 [name], [name] ([id number]) -------------------------------------------------------------------------------- Debridement Details Patient Name: Oscar Mirza C. Date of Service: 05/21/2015 8:45 AM Medical Record Number: [id number] Patient Account Number: [id number] Date of Birth/Sex: 1971/10/14 (44 y.o. Male) Treating RN: Primary Care Physician: Mayford Knife, ERIC Other Clinician: Referring Physician: Mayford Knife, ERIC Treating Physician/Extender: Rudene Re in Treatment: 1 Debridement Performed for Wound #1 Right,Lateral Lower Leg Assessment: Performed By: Physician Tristan Schroeder., MD Debridement: Debridement Pre-procedure Yes Verification/Time Out Taken: Start Time: 09:14 Pain Control: Other : lidocaine 4% Level: Skin/Subcutaneous Tissue Total Area Debrided (L x 3 (cm) x 3.4 (cm) = 10.2 (cm) W): Tissue and other Viable, Non-Viable, Eschar, Exudate, Fibrin/Slough, Subcutaneous material debrided: Instrument: Curette Bleeding: Moderate Hemostasis Achieved: Pressure End Time: 09:19 Procedural Pain: 1 Post Procedural Pain: 1 Response to Treatment: Procedure was tolerated well Post  Debridement Measurements of Total Wound Length: (cm) 3 Width: (cm) 3.4 Depth: (cm) 0.2 Volume: (cm) 1.602 Electronic Signature(s) Signed: 05/21/2015 9:20:43 AM By: Evlyn Kanner MD, FACS Entered By: Evlyn Kanner on 05/21/2015 09:20:43 [name], [name] ([id number]) -------------------------------------------------------------------------------- HPI Details Patient Name: Oscar Mirza C. Date of Service: 05/21/2015 8:45 AM Medical Record Number: [id number] Patient Account Number: [id number] Date of Birth/Sex: 01-01-1971 (44 y.o. Male) Treating RN: Primary Care Physician: Mayford Knife, ERIC Other Clinician: Referring Physician: Mayford Knife, ERIC Treating Physician/Extender: Rudene Re in Treatment: 1 History of Present Illness Location: bilateral lower extremity swelling and ulceration Quality: Patient reports experiencing a dull pain to affected area(s). Severity: Patient states wound are getting worse. Duration: Patient has had the wound for > 3 months prior to seeking treatment at the wound center Timing: Pain in wound is Intermittent (comes and goes Context: The wound appeared gradually over time Modifying Factors: Other treatment(s) tried include: has seen his PCP and has been on Keflex. Associated Signs and Symptoms: Patient reports having difficulty standing for long periods. HPI Description: This 44 year old gentleman has been referred from his PCPs office where he was seen by Esperanza Sheets, PA. The patient is known to be seen most recently for morbidly obese, sleep apnea, chronic lower extremity edema, osteoarthritis, pain in the legs. His past medical history is significant for chronic venous insufficiency, pain in the back, morbid obesity, cardiomegaly, osteoarthritis of the hip, sleep apnea, kidney stones, hematuria, ulcers both lower extremities. He had a left hip arthroplasty in April of this year. He has never had venous duplex studies done on his lower extremities  though he has been to the wound center several years ago. He has also not been wearing his compression stockings. 05/21/2015. He has been compliant with elevation of the limb and wearing his sleep apnea machine so that he can breathe better and try and get to sleep in a bed. His vascular studies as scheduled for early August. Electronic Signature(s) Signed: 05/21/2015 9:21:34 AM By: Evlyn Kanner MD, FACS Entered By: Evlyn Kanner on  05/21/2015 09:21:34 [name], [name] Kitchen ([id number]) -------------------------------------------------------------------------------- Physical Exam Details Patient Name: [name], [name] Date of Service: 05/21/2015 8:45 AM Medical Record Number: [id number] Patient Account Number: [id number] Date of Birth/Sex: Jul 23, 1971 (44 y.o. Male) Treating RN: Primary Care Physician: Mayford Knife, ERIC Other Clinician: Referring Physician: Mayford Knife, ERIC Treating Physician/Extender: Rudene Re in Treatment: 1 Constitutional . Pulse regular. Respirations normal and unlabored. Afebrile. . Eyes Nonicteric. Reactive to light. Ears, Nose, Mouth, and Throat Lips, teeth, and gums WNL.Marland Kitchen Moist mucosa without lesions . Neck supple and nontender. No palpable supraclavicular or cervical adenopathy. Normal sized without goiter. Respiratory WNL. No retractions.. Cardiovascular Pedal Pulses WNL. No clubbing, cyanosis or edema. Musculoskeletal Adexa without tenderness or enlargement.. Digits and nails w/o clubbing, cyanosis, infection, petechiae, ischemia, or inflammatory conditions.. Integumentary (Hair, Skin) No suspicious lesions. No crepitus or fluctuance. No peri-wound warmth or erythema. No masses.Marland Kitchen Psychiatric Judgement and insight Intact.. No evidence of depression, anxiety, or agitation.. Notes the ulceration on the right lateral lower extremity has a lot of slough and mentally sharp debridement. The ones on the left lower chest but is looking cleaner. Electronic  Signature(s) Signed: 05/21/2015 9:22:05 AM By: Evlyn Kanner MD, FACS Entered By: Evlyn Kanner on 05/21/2015 09:22:04 [name] ([id number]) -------------------------------------------------------------------------------- Physician Orders Details Patient Name: Oscar Mirza C. Date of Service: 05/21/2015 8:45 AM Medical Record Number: [id number] Patient Account Number: [id number] Date of Birth/Sex: 09/26/1971 (44 y.o. Male) Treating RN: Huel Coventry Primary Care Physician: Mayford Knife, ERIC Other Clinician: Referring Physician: Mayford Knife, ERIC Treating Physician/Extender: Rudene Re in Treatment: 1 Verbal / Phone Orders: Yes Clinician: Huel Coventry Read Back and Verified: Yes Diagnosis Coding Wound Cleansing Wound #1 Right,Lateral Lower Leg o Clean wound with Normal Saline. Wound #2 Left,Lateral Lower Leg o Clean wound with Normal Saline. Wound #3 Left,Posterior Lower Leg o Clean wound with Normal Saline. Anesthetic Wound #1 Right,Lateral Lower Leg o Topical Lidocaine 4% cream applied to wound bed prior to debridement Wound #2 Left,Lateral Lower Leg o Topical Lidocaine 4% cream applied to wound bed prior to debridement Wound #3 Left,Posterior Lower Leg o Topical Lidocaine 4% cream applied to wound bed prior to debridement Skin Barriers/Peri-Wound Care Wound #1 Right,Lateral Lower Leg o Barrier cream Wound #2 Left,Lateral Lower Leg o Barrier cream Wound #3 Left,Posterior Lower Leg o Barrier cream Primary Wound Dressing Wound #1 Right,Lateral Lower Leg o Aquacel Ag Wound #2 Left,Lateral Lower Leg o Aquacel Ag [name], [name] ([id number]) Wound #3 Left,Posterior Lower Leg o Aquacel Ag Secondary Dressing Wound #1 Right,Lateral Lower Leg o XtraSorb Wound #2 Left,Lateral Lower Leg o XtraSorb Wound #3 Left,Posterior Lower Leg o XtraSorb Dressing Change Frequency Wound #1 Right,Lateral Lower Leg o Change dressing every  week Wound #2 Left,Lateral Lower Leg o Change dressing every week Wound #3 Left,Posterior Lower Leg o Change dressing every week Follow-up Appointments Wound #1 Right,Lateral Lower Leg o Return Appointment in 1 week. o Nurse Visit as needed - Thursday for [facility] Change Wound #2 Left,Lateral Lower Leg o Return Appointment in 1 week. o Nurse Visit as needed - Thursday for [facility] Change Wound #3 Left,Posterior Lower Leg o Return Appointment in 1 week. o Nurse Visit as needed - Thursday for [facility] Change Edema Control Wound #1 Right,Lateral Lower Leg o Unna Boots Bilaterally o Elevate legs to the level of the heart and pump ankles as often as possible Wound #2 Left,Lateral Lower Leg o Unna Boots Bilaterally o Elevate legs to the level of the heart and pump ankles as  often as possible Wound #3 Left,Posterior Lower Leg o Unna Boots Bilaterally [place], Cannan C. ([id number]) o Elevate legs to the level of the heart and pump ankles as often as possible Additional Orders / Instructions Wound #1 Right,Lateral Lower Leg o Increase protein intake. o OK to return to work o Activity as tolerated o Other: - C-pap to be used every night Wound #2 Left,Lateral Lower Leg o Increase protein intake. o OK to return to work o Activity as tolerated o Other: - C-pap to be used every night Wound #3 Left,Posterior Lower Leg o Increase protein intake. o OK to return to work o Activity as tolerated o Other: - C-pap to be used every Counselling psychologist) Signed: 05/21/2015 12:13:47 PM By: Evlyn Kanner MD, FACS Signed: 05/21/2015 4:23:51 PM By: Elliot Gurney RN, BSN, Kim RN, BSN Entered By: Elliot Gurney, RN, BSN, Kim on 05/21/2015 09:19:01 Rhyner, [name] ([id number]) -------------------------------------------------------------------------------- Problem List Details Patient Name: Banas, Jarell C. Date of Service: 05/21/2015 8:45  AM Medical Record Number: [id number] Patient Account Number: [id number] Date of Birth/Sex: 1971-03-17 (44 y.o. Male) Treating RN: Primary Care Physician: Mayford Knife, ERIC Other Clinician: Referring Physician: Mayford Knife, ERIC Treating Physician/Extender: Rudene Re in Treatment: 1 Active Problems ICD-10 Encounter Code Description Active Date Diagnosis I87.313 Chronic venous hypertension (idiopathic) with ulcer of 05/14/2015 Yes bilateral lower extremity L97.212 Non-pressure chronic ulcer of right calf with fat layer 05/14/2015 Yes exposed L97.222 Non-pressure chronic ulcer of left calf with fat layer 05/14/2015 Yes exposed E66.01 Morbid (severe) obesity due to excess calories 05/14/2015 Yes I89.0 Lymphedema, not elsewhere classified 05/14/2015 Yes Inactive Problems Resolved Problems Electronic Signature(s) Signed: 05/21/2015 9:20:34 AM By: Evlyn Kanner MD, FACS Entered By: Evlyn Kanner on 05/21/2015 09:20:34 [name], [name] ([id number]) -------------------------------------------------------------------------------- Progress Note Details Patient Name: [name], [name] Date of Service: 05/21/2015 8:45 AM Medical Record Number: [id number] Patient Account Number: [id number] Date of Birth/Sex: [birth year] (44 y.o. Male) Treating RN: Primary Care Physician: Mayford Knife, ERIC Other Clinician: Referring Physician: Mayford Knife, ERIC Treating Physician/Extender: Rudene Re in Treatment: 1 Subjective Chief Complaint Information obtained from Patient Patient presents for treatment of an open ulcer due to venous insufficiency. 44 year old gentleman who has bilateral lower leg swelling and ulceration for the last 3 months. History of Present Illness (HPI) The following HPI elements were documented for the patient's wound: Location: bilateral lower extremity swelling and ulceration Quality: Patient reports experiencing a dull pain to affected area(s). Severity: Patient states wound are  getting worse. Duration: Patient has had the wound for > 3 months prior to seeking treatment at the wound center Timing: Pain in wound is Intermittent (comes and goes Context: The wound appeared gradually over time Modifying Factors: Other treatment(s) tried include: has seen his PCP and has been on Keflex. Associated Signs and Symptoms: Patient reports having difficulty standing for long periods. This 44 year old gentleman has been referred from his PCPs office where he was seen by Esperanza Sheets, PA. The patient is known to be seen most recently for morbidly obese, sleep apnea, chronic lower extremity edema, osteoarthritis, pain in the legs. His past medical history is significant for chronic venous insufficiency, pain in the back, morbid obesity, cardiomegaly, osteoarthritis of the hip, sleep apnea, kidney stones, hematuria, ulcers both lower extremities. He had a left hip arthroplasty in April of this year. He has never had venous duplex studies done on his lower extremities though he has been to the wound center several years ago. He has also not been wearing his compression stockings. 05/21/2015. He  has been compliant with elevation of the limb and wearing his sleep apnea machine so that he can breathe better and try and get to sleep in a bed. His vascular studies as scheduled for early August. Objective Constitutional [name], [name] ([id number]) Pulse regular. Respirations normal and unlabored. Afebrile. Vitals Time Taken: 8:46 AM, Height: 72 in, Weight: 386 lbs, BMI: 52.3, Temperature: 98.5 F, Pulse: 82 bpm, Respiratory Rate: 18 breaths/min, Blood Pressure: 126/60 mmHg. Eyes Nonicteric. Reactive to light. Ears, Nose, Mouth, and Throat Lips, teeth, and gums WNL.Marland Kitchen Moist mucosa without lesions . Neck supple and nontender. No palpable supraclavicular or cervical adenopathy. Normal sized without goiter. Respiratory WNL. No retractions.. Cardiovascular Pedal Pulses WNL. No  clubbing, cyanosis or edema. Musculoskeletal Adexa without tenderness or enlargement.. Digits and nails w/o clubbing, cyanosis, infection, petechiae, ischemia, or inflammatory conditions.Marland Kitchen Psychiatric Judgement and insight Intact.. No evidence of depression, anxiety, or agitation.. General Notes: the ulceration on the right lateral lower extremity has a lot of slough and mentally sharp debridement. The ones on the left lower chest but is looking cleaner. Integumentary (Hair, Skin) No suspicious lesions. No crepitus or fluctuance. No peri-wound warmth or erythema. No masses.. Wound #1 status is Open. Original cause of wound was Gradually Appeared. The wound is located on the Right,Lateral Lower Leg. The wound measures 3cm length x 3.4cm width x 0.1cm depth; 8.011cm^2 area and 0.801cm^3 volume. The wound is limited to skin breakdown. There is a large amount of serous drainage noted. The wound margin is indistinct and nonvisible. There is small (1-33%) pink granulation within the wound bed. There is a large (67-100%) amount of necrotic tissue within the wound bed including Adherent Slough. The periwound skin appearance exhibited: Maceration, Moist. Periwound temperature was noted as No Abnormality. The periwound has tenderness on palpation. Wound #2 status is Open. Original cause of wound was Gradually Appeared. The wound is located on the Left,Lateral Lower Leg. The wound measures 6cm length x 3cm width x 0.1cm depth; 14.137cm^2 area and 1.414cm^3 volume. The wound is limited to skin breakdown. There is a large amount of serous drainage noted. The wound margin is indistinct and nonvisible. There is small (1-33%) red granulation within the wound bed. There is a medium (34-66%) amount of necrotic tissue within the wound bed including Adherent Slough. The periwound skin appearance exhibited: Moist. Wound #3 status is Open. Original cause of wound was Gradually Appeared. The wound is located on  the [place], DYSON C. ([id number]) Left,Posterior Lower Leg. The wound measures 1.5cm length x 2cm width x 0.1cm depth; 2.356cm^2 area and 0.236cm^3 volume. The wound is limited to skin breakdown. There is a large amount of serous drainage noted. The wound margin is flat and intact. There is no granulation within the wound bed. There is a large (67-100%) amount of necrotic tissue within the wound bed including Adherent Slough. The periwound skin appearance did not exhibit: Callus, Crepitus, Excoriation, Fluctuance, Friable, Induration, Localized Edema, Rash, Scarring, Dry/Scaly, Maceration, Moist, Atrophie Blanche, Cyanosis, Ecchymosis, Hemosiderin Staining, Mottled, Pallor, Rubor, Erythema. Assessment Active Problems ICD-10 I87.313 - Chronic venous hypertension (idiopathic) with ulcer of bilateral lower extremity L97.212 - Non-pressure chronic ulcer of right calf with fat layer exposed L97.222 - Non-pressure chronic ulcer of left calf with fat layer exposed E66.01 - Morbid (severe) obesity due to excess calories I89.0 - Lymphedema, not elsewhere classified We will continue the silver alginate and the wound and Unna's boots bilaterally and he will continue to try and elevate his legs as much as  possible during the day and night. He will come back and see me next week. Procedures Wound #1 Wound #1 is a Venous Leg Ulcer located on the Right,Lateral Lower Leg . There was a Skin/Subcutaneous Tissue Debridement (22482-50037) debridement with total area of 10.2 sq cm performed by Tyffani Foglesong, Ignacia Felling., MD. with the following instrument(s): Curette to remove Viable and Non-Viable tissue/material including Exudate, Fibrin/Slough, Eschar, and Subcutaneous after achieving pain control using Other (lidocaine 4%). A time out was conducted prior to the start of the procedure. A Moderate amount of bleeding was controlled with Pressure. The procedure was tolerated well with a pain level of 1 throughout and a  pain level of 1 following the procedure. Post Debridement Measurements: 3cm length x 3.4cm width x 0.2cm depth; 1.602cm^3 volume. Plan [name], [name] ([id number]) Wound Cleansing: Wound #1 Right,Lateral Lower Leg: Clean wound with Normal Saline. Wound #2 Left,Lateral Lower Leg: Clean wound with Normal Saline. Wound #3 Left,Posterior Lower Leg: Clean wound with Normal Saline. Anesthetic: Wound #1 Right,Lateral Lower Leg: Topical Lidocaine 4% cream applied to wound bed prior to debridement Wound #2 Left,Lateral Lower Leg: Topical Lidocaine 4% cream applied to wound bed prior to debridement Wound #3 Left,Posterior Lower Leg: Topical Lidocaine 4% cream applied to wound bed prior to debridement Skin Barriers/Peri-Wound Care: Wound #1 Right,Lateral Lower Leg: Barrier cream Wound #2 Left,Lateral Lower Leg: Barrier cream Wound #3 Left,Posterior Lower Leg: Barrier cream Primary Wound Dressing: Wound #1 Right,Lateral Lower Leg: Aquacel Ag Wound #2 Left,Lateral Lower Leg: Aquacel Ag Wound #3 Left,Posterior Lower Leg: Aquacel Ag Secondary Dressing: Wound #1 Right,Lateral Lower Leg: XtraSorb Wound #2 Left,Lateral Lower Leg: XtraSorb Wound #3 Left,Posterior Lower Leg: XtraSorb Dressing Change Frequency: Wound #1 Right,Lateral Lower Leg: Change dressing every week Wound #2 Left,Lateral Lower Leg: Change dressing every week Wound #3 Left,Posterior Lower Leg: Change dressing every week Follow-up Appointments: Wound #1 Right,Lateral Lower Leg: Return Appointment in 1 week. Nurse Visit as needed - Thursday for [facility] Change Wound #2 Left,Lateral Lower Leg: Return Appointment in 1 week. Nurse Visit as needed - Thursday for [facility] Change Wound #3 Left,Posterior Lower Leg: [name], [name] ([id number]) Return Appointment in 1 week. Nurse Visit as needed - Thursday for [facility] Change Edema Control: Wound #1 Right,Lateral Lower Leg: Unna Boots Bilaterally Elevate  legs to the level of the heart and pump ankles as often as possible Wound #2 Left,Lateral Lower Leg: Unna Boots Bilaterally Elevate legs to the level of the heart and pump ankles as often as possible Wound #3 Left,Posterior Lower Leg: Unna Boots Bilaterally Elevate legs to the level of the heart and pump ankles as often as possible Additional Orders / Instructions: Wound #1 Right,Lateral Lower Leg: Increase protein intake. OK to return to work Activity as tolerated Other: - C-pap to be used every night Wound #2 Left,Lateral Lower Leg: Increase protein intake. OK to return to work Activity as tolerated Other: - C-pap to be used every night Wound #3 Left,Posterior Lower Leg: Increase protein intake. OK to return to work Activity as tolerated Other: - C-pap to be used every night We will continue the silver alginate and the wound and Unna's boots bilaterally and he will continue to try and elevate his legs as much as possible during the day and night. He will come back and see me next week. Electronic Signature(s) Signed: 05/21/2015 9:23:16 AM By: Evlyn Kanner MD, FACS Entered By: Evlyn Kanner on 05/21/2015 09:23:16 [name], [name] ([id number]) -------------------------------------------------------------------------------- SuperBill Details Patient Name:  Spadafora, Othel C. Date of Service: 05/21/2015 Medical Record Number: [id number] Patient Account Number: [id number] Date of Birth/Sex: May 15, 1971 (44 y.o. Male) Treating RN: Primary Care Physician: Mayford Knife, ERIC Other Clinician: Referring Physician: Mayford Knife, ERIC Treating Physician/Extender: Rudene Re in Treatment: 1 Diagnosis Coding ICD-10 Codes Code Description I87.313 Chronic venous hypertension (idiopathic) with ulcer of bilateral lower extremity L97.212 Non-pressure chronic ulcer of right calf with fat layer exposed L97.222 Non-pressure chronic ulcer of left calf with fat layer exposed E66.01 Morbid  (severe) obesity due to excess calories I89.0 Lymphedema, not elsewhere classified Facility Procedures CPT4: Description Modifier Quantity Code [id number] 11042 - DEB SUBQ TISSUE 20 SQ CM/< 1 ICD-10 Description Diagnosis I87.313 Chronic venous hypertension (idiopathic) with ulcer of bilateral lower extremity L97.212 Non-pressure chronic ulcer of right calf  with fat layer exposed L97.222 Non-pressure chronic ulcer of left calf with fat layer exposed Physician Procedures CPT4: Description Modifier Quantity Code [id number] 11042 - WC PHYS SUBQ TISS 20 SQ CM 1 ICD-10 Description Diagnosis I87.313 Chronic venous hypertension (idiopathic) with ulcer of bilateral lower extremity L97.212 Non-pressure chronic ulcer of right calf  with fat layer exposed L97.222 Non-pressure chronic ulcer of left calf with fat layer exposed Electronic Signature(s) Signed: 05/21/2015 9:23:40 AM By: Evlyn Kanner MD, FACS Previous Signature: 05/21/2015 9:23:30 AM Version By: Evlyn Kanner MD, FACS [place], [name] ([id number]) Entered By: Evlyn Kanner on 05/21/2015 09:23:40

## 2015-05-22 NOTE — Progress Notes (Signed)
ELIAZ, FOUT ([id number]) Visit Report for 05/21/2015 Arrival Information Details Patient Name: [name], [name]. Date of Service: 05/21/2015 8:45 AM Medical Record Number: [id number] Patient Account Number: [id number] Date of Birth/Sex: Jan 05, 1971 (44 y.o. Male) Treating RN: Huel Coventry Primary Care Physician: Mayford Knife, ERIC Other Clinician: Referring Physician: Mayford Knife, ERIC Treating Physician/Extender: Rudene Re in Treatment: 1 Visit Information History Since Last Visit Added or deleted any medications: No Patient Arrived: Ambulatory Any new allergies or adverse reactions: No Arrival Time: 08:46 Had a fall or experienced change in No Accompanied By: self activities of daily living that may affect Transfer Assistance: None risk of falls: Patient Identification Verified: Yes Signs or symptoms of abuse/neglect since last No Secondary Verification Process Yes visito Completed: Hospitalized since last visit: No Patient Has Alerts: Yes Has Dressing in Place as Prescribed: Yes Patient Alerts: Patient on Blood Has Compression in Place as Prescribed: Yes Thinner Pain Present Now: No Electronic Signature(s) Signed: 05/21/2015 4:23:51 PM By: Elliot Gurney, RN, BSN, Kim RN, BSN Entered By: Elliot Gurney, RN, BSN, Kim on 05/21/2015 08:46:37 Pfund, [name] ([id number]) -------------------------------------------------------------------------------- Encounter Discharge Information Details Patient Name: [name], [name] Date of Service: 05/21/2015 8:45 AM Medical Record Number: [id number] Patient Account Number: [id number] Date of Birth/Sex: 05-31-1971 (44 y.o. Male) Treating RN: Huel Coventry Primary Care Physician: Mayford Knife, ERIC Other Clinician: Referring Physician: Mayford Knife, ERIC Treating Physician/Extender: Rudene Re in Treatment: 1 Encounter Discharge Information Items Discharge Pain Level: 0 Discharge Condition: Stable Ambulatory Status: Ambulatory Discharge  Destination: Home Private Transportation: Auto Accompanied By: Denton Lank Schedule Follow-up Appointment: Yes Medication Reconciliation completed and Yes provided to Patient/Care Vertis Bauder: Clinical Summary of Care: Electronic Signature(s) Signed: 05/21/2015 4:23:51 PM By: Elliot Gurney, RN, BSN, Kim RN, BSN Entered By: Elliot Gurney, RN, BSN, Kim on 05/21/2015 09:34:55 Zuleta, [name] ([id number]) -------------------------------------------------------------------------------- Lower Extremity Assessment Details Patient Name: [name], [name] Date of Service: 05/21/2015 8:45 AM Medical Record Number: [id number] Patient Account Number: [id number] Date of Birth/Sex: 20-Oct-1971 (44 y.o. Male) Treating RN: Huel Coventry Primary Care Physician: Mayford Knife, ERIC Other Clinician: Referring Physician: Mayford Knife, ERIC Treating Physician/Extender: Rudene Re in Treatment: 1 Edema Assessment Assessed: [Left: No] [Right: No] E[Left: dema] [Right: :] Calf Left: Right: Point of Measurement: 33 cm From Medial Instep 47 cm 47.4 cm Ankle Left: Right: Point of Measurement: 11 cm From Medial Instep 31.4 cm 30.4 cm Vascular Assessment Pulses: Posterior Tibial Dorsalis Pedis Palpable: [Left:Yes] [Right:Yes] Extremity colors, hair growth, and conditions: Extremity Color: [Left:Mottled] [Right:Mottled] Hair Growth on Extremity: [Left:No] [Right:No] Temperature of Extremity: [Left:Warm] [Right:Warm] Capillary Refill: [Left:< 3 seconds] [Right:< 3 seconds] Toe Nail Assessment Left: Right: Thick: Yes Yes Discolored: Yes Yes Deformed: Yes Yes Improper Length and Hygiene: Yes Yes Electronic Signature(s) Signed: 05/21/2015 4:23:51 PM By: Elliot Gurney, RN, BSN, Kim RN, BSN Entered By: Elliot Gurney, RN, BSN, Kim on 05/21/2015 09:00:06 [place]aston, [name] ([id number]) -------------------------------------------------------------------------------- Multi Wound Chart Details Patient Name: Oscar Mirza C. Date of Service:  05/21/2015 8:45 AM Medical Record Number: [id number] Patient Account Number: [id number] Date of Birth/Sex: 1971/04/26 (44 y.o. Male) Treating RN: Huel Coventry Primary Care Physician: Mayford Knife, ERIC Other Clinician: Referring Physician: Mayford Knife, ERIC Treating Physician/Extender: Rudene Re in Treatment: 1 Vital Signs Height(in): 72 Pulse(bpm): 82 Weight(lbs): 386 Blood Pressure 126/60 (mmHg): Body Mass Index(BMI): 52 Temperature(F): 98.5 Respiratory Rate 18 (breaths/min): Photos: [1:No Photos] [2:No Photos] [3:No Photos] Wound Location: [1:Right Lower Leg - Lateral Left Lower Leg - Lateral Left Lower Leg - Posterior] Wounding Event: [1:Gradually Appeared] [2:Gradually Appeared] [3:Gradually Appeared] Primary Etiology: [1:Venous Leg  Ulcer] [2:Venous Leg Ulcer] [3:Venous Leg Ulcer] Comorbid History: [1:Sleep Apnea, Rheumatoid Sleep Apnea, Rheumatoid Sleep Apnea, Rheumatoid Arthritis] [2:Arthritis] [3:Arthritis] Date Acquired: [1:01/26/2015] [2:01/26/2015] [3:01/26/2015] Weeks of Treatment: [1:1] [2:1] [3:1] Wound Status: [1:Open] [2:Open] [3:Open] Measurements L x W x D 3x3.4x0.1 [2:6x3x0.1] [3:1.5x2x0.1] (cm) Area (cm) : [1:8.011] [2:14.137] [3:2.356] Volume (cm) : [1:0.801] [2:1.414] [3:0.236] % Reduction in Area: [1:19.00%] [2:51.40%] [3:-1102.00%] % Reduction in Volume: 19.10% [2:51.30%] [3:-1080.00%] Classification: [1:Full Thickness Without Exposed Support Structures] [2:Partial Thickness] [3:Partial Thickness] Exudate Amount: [1:Large] [2:Large] [3:Large] Exudate Type: [1:Serous] [2:Serous] [3:Serous] Exudate Color: [1:amber] [2:amber] [3:amber] Wound Margin: [1:Indistinct, nonvisible] [2:Indistinct, nonvisible] [3:Flat and Intact] Granulation Amount: [1:Small (1-33%)] [2:Small (1-33%)] [3:None Present (0%)] Granulation Quality: [1:Pink, Hyper-granulation Red, Hyper-granulation] [3:N/A] Necrotic Amount: [1:Large (67-100%)] [2:Medium (34-66%)] [3:Large (67-100%)] Exposed  Structures: [1:Fascia: No Fat: No Tendon: No Muscle: No Joint: No] [2:Fascia: No Fat: No Tendon: No Muscle: No Joint: No] [3:Fascia: No Fat: No Tendon: No Muscle: No Joint: No] Bone: No Bone: No Bone: No Limited to Skin Limited to Skin Limited to Skin Breakdown Breakdown Breakdown Epithelialization: Small (1-33%) None None Periwound Skin Texture: No Abnormalities Noted No Abnormalities Noted Edema: No Excoriation: No Induration: No Callus: No Crepitus: No Fluctuance: No Friable: No Rash: No Scarring: No Periwound Skin Maceration: Yes Moist: Yes Maceration: No Moisture: Moist: Yes Moist: No Dry/Scaly: No Periwound Skin Color: No Abnormalities Noted No Abnormalities Noted Atrophie Blanche: No Cyanosis: No Ecchymosis: No Erythema: No Hemosiderin Staining: No Mottled: No Pallor: No Rubor: No Temperature: No Abnormality N/A N/A Tenderness on Yes No No Palpation: Wound Preparation: Ulcer Cleansing: Ulcer Cleansing: Ulcer Cleansing: Rinsed/Irrigated with Rinsed/Irrigated with Rinsed/Irrigated with Saline Saline Saline Topical Anesthetic Topical Anesthetic Topical Anesthetic Applied: Other: lidociane Applied: Other: lidoCAINE Applied: Other: 4% 4% LIDOCAINE 4% Treatment Notes Electronic Signature(s) Signed: 05/21/2015 4:23:51 PM By: Elliot Gurney, RN, BSN, Kim RN, BSN Entered By: Elliot Gurney, RN, BSN, Kim on 05/21/2015 09:07:41 Fruin, [name] ([id number]) -------------------------------------------------------------------------------- Multi-Disciplinary Care Plan Details Patient Name: [name], [name] Date of Service: 05/21/2015 8:45 AM Medical Record Number: [id number] Patient Account Number: [id number] Date of Birth/Sex: June 28, 1971 (44 y.o. Male) Treating RN: Huel Coventry Primary Care Physician: Mayford Knife, ERIC Other Clinician: Referring Physician: Mayford Knife, ERIC Treating Physician/Extender: Rudene Re in Treatment: 1 Active Inactive Orientation to the Wound Care  Program Nursing Diagnoses: Knowledge deficit related to the wound healing center program Goals: Patient/caregiver will verbalize understanding of the Wound Healing Center Program Date Initiated: 05/14/2015 Goal Status: Active Interventions: Provide education on orientation to the wound center Notes: Venous Leg Ulcer Nursing Diagnoses: Potential for venous Insuffiency (use before diagnosis confirmed) Goals: Non-invasive venous studies are completed as ordered Date Initiated: 05/14/2015 Goal Status: Active Interventions: Assess peripheral edema status every visit. Treatment Activities: Non-invasive vascular studies : 05/21/2015 Notes: Wound/Skin Impairment Nursing Diagnoses: Impaired tissue integrity Harts, Milano C. ([id number]) Goals: Patient/caregiver will verbalize understanding of skin care regimen Date Initiated: 05/14/2015 Goal Status: Active Ulcer/skin breakdown will heal within 14 weeks Date Initiated: 05/14/2015 Goal Status: Active Interventions: Assess patient/caregiver ability to obtain necessary supplies Notes: Electronic Signature(s) Signed: 05/21/2015 4:23:51 PM By: Elliot Gurney, RN, BSN, Kim RN, BSN Entered By: Elliot Gurney, RN, BSN, Kim on 05/21/2015 09:07:35 Britz, [name] ([id number]) -------------------------------------------------------------------------------- Pain Assessment Details Patient Name: [name], [name] Date of Service: 05/21/2015 8:45 AM Medical Record Number: [id number] Patient Account Number: [id number] Date of Birth/Sex: 08/07/1971 (44 y.o. Male) Treating RN: Huel Coventry Primary Care Physician: Mayford Knife, ERIC Other Clinician: Referring Physician: Mayford Knife, ERIC Treating Physician/Extender: Rudene Re in Treatment:  1 Active Problems Location of Pain Severity and Description of Pain Patient Has Paino No Site Locations Pain Management and Medication Current Pain Management: Electronic Signature(s) Signed: 05/21/2015 4:23:51 PM By: Elliot Gurney,  RN, BSN, Kim RN, BSN Entered By: Elliot Gurney, RN, BSN, Kim on 05/21/2015 08:46:43 Koran, [name] ([id number]) -------------------------------------------------------------------------------- Patient/Caregiver Education Details Patient Name: Oscar Civil. Date of Service: 05/21/2015 8:45 AM Medical Record Number: [id number] Patient Account Number: [id number] Date of Birth/Gender: 08/08/1971 (44 y.o. Male) Treating RN: Huel Coventry Primary Care Physician: Mayford Knife, ERIC Other Clinician: Referring Physician: Mayford Knife, ERIC Treating Physician/Extender: Rudene Re in Treatment: 1 Education Assessment Education Provided To: Patient Education Topics Provided Venous: Handouts: Other: ELEVATE AND CONTINUE COMPRESSION AS PRESCRIBED Electronic Signature(s) Signed: 05/21/2015 4:23:51 PM By: Elliot Gurney, RN, BSN, Kim RN, BSN Entered By: Elliot Gurney, RN, BSN, Kim on 05/21/2015 09:35:17 Finkbiner, [name] ([id number]) -------------------------------------------------------------------------------- Wound Assessment Details Patient Name: [name], [name] Date of Service: 05/21/2015 8:45 AM Medical Record Number: [id number] Patient Account Number: [id number] Date of Birth/Sex: 12/26/1970 (44 y.o. Male) Treating RN: Huel Coventry Primary Care Physician: Mayford Knife, ERIC Other Clinician: Referring Physician: Mayford Knife, ERIC Treating Physician/Extender: Rudene Re in Treatment: 1 Wound Status Wound Number: 1 Primary Etiology: Venous Leg Ulcer Wound Location: Right Lower Leg - Lateral Wound Status: Open Wounding Event: Gradually Appeared Comorbid Sleep Apnea, Rheumatoid History: Arthritis Date Acquired: 01/26/2015 Weeks Of Treatment: 1 Clustered Wound: No Photos Photo Uploaded By: Elliot Gurney, RN, BSN, Kim on 05/21/2015 11:10:48 Wound Measurements Length: (cm) 3 Width: (cm) 3.4 Depth: (cm) 0.1 Area: (cm) 8.011 Volume: (cm) 0.801 % Reduction in Area: 19% % Reduction in Volume:  19.1% Epithelialization: Small (1-33%) Wound Description Full Thickness Without Exposed Classification: Support Structures Wound Margin: Indistinct, nonvisible Exudate Large Amount: Exudate Type: Serous Exudate Color: amber Wound Bed Granulation Amount: Small (1-33%) Exposed Structure Granulation Quality: Pink, Hyper-granulation Fascia Exposed: No Necrotic Amount: Large (67-100%) Fat Layer Exposed: No Felmlee, Deric C. ([id number]) Necrotic Quality: Adherent Slough Tendon Exposed: No Muscle Exposed: No Joint Exposed: No Bone Exposed: No Limited to Skin Breakdown Periwound Skin Texture Texture Color No Abnormalities Noted: No No Abnormalities Noted: No Moisture Temperature / Pain No Abnormalities Noted: No Temperature: No Abnormality Maceration: Yes Tenderness on Palpation: Yes Moist: Yes Wound Preparation Ulcer Cleansing: Rinsed/Irrigated with Saline Topical Anesthetic Applied: Other: lidociane 4%, Treatment Notes Wound #1 (Right, Lateral Lower Leg) 1. Cleansed with: Clean wound with Normal Saline 2. Anesthetic Topical Lidocaine 4% cream to wound bed prior to debridement 3. Peri-wound Care: Barrier cream 4. Dressing Applied: Aquacel Ag 7. Secured with [employer] Bilaterally Notes XTRA SORB Electronic Signature(s) Signed: 05/21/2015 4:23:51 PM By: Elliot Gurney, RN, BSN, Kim RN, BSN Entered By: Elliot Gurney, RN, BSN, Kim on 05/21/2015 09:02:51 Creson, [name] ([id number]) -------------------------------------------------------------------------------- Wound Assessment Details Patient Name: [name], [name] Date of Service: 05/21/2015 8:45 AM Medical Record Number: [id number] Patient Account Number: [id number] Date of Birth/Sex: April 16, 1971 (44 y.o. Male) Treating RN: Huel Coventry Primary Care Physician: Mayford Knife, ERIC Other Clinician: Referring Physician: Mayford Knife, ERIC Treating Physician/Extender: Rudene Re in Treatment: 1 Wound Status Wound Number:  2 Primary Etiology: Venous Leg Ulcer Wound Location: Left Lower Leg - Lateral Wound Status: Open Wounding Event: Gradually Appeared Comorbid Sleep Apnea, Rheumatoid History: Arthritis Date Acquired: 01/26/2015 Weeks Of Treatment: 1 Clustered Wound: No Photos Photo Uploaded By: Elliot Gurney, RN, BSN, Kim on 05/21/2015 11:10:49 Wound Measurements Length: (cm) 6 Width: (cm) 3 Depth: (cm) 0.1 Area: (cm) 14.137 Volume: (cm) 1.414 % Reduction in Area: 51.4% % Reduction in  Volume: 51.3% Epithelialization: None Wound Description Classification: Partial Thickness Wound Margin: Indistinct, nonvisible Exudate Amount: Large Exudate Type: Serous Exudate Color: amber Wound Bed Granulation Amount: Small (1-33%) Exposed Structure Granulation Quality: Red, Hyper-granulation Fascia Exposed: No Necrotic Amount: Medium (34-66%) Fat Layer Exposed: No Necrotic Quality: Adherent Slough Tendon Exposed: No [name], [name] ([id number]) Muscle Exposed: No Joint Exposed: No Bone Exposed: No Limited to Skin Breakdown Periwound Skin Texture Texture Color No Abnormalities Noted: No No Abnormalities Noted: No Moisture No Abnormalities Noted: No Moist: Yes Wound Preparation Ulcer Cleansing: Rinsed/Irrigated with Saline Topical Anesthetic Applied: Other: lidoCAINE 4%, Treatment Notes Wound #2 (Left, Lateral Lower Leg) 1. Cleansed with: Clean wound with Normal Saline 2. Anesthetic Topical Lidocaine 4% cream to wound bed prior to debridement 3. Peri-wound Care: Barrier cream 4. Dressing Applied: Aquacel Ag 7. Secured with [employer] Notes XTRA SORB Electronic Signature(s) Signed: 05/21/2015 4:23:51 PM By: Elliot Gurney, RN, BSN, Kim RN, BSN Entered By: Elliot Gurney, RN, BSN, Kim on 05/21/2015 09:03:08 Kegley, [name] ([id number]) -------------------------------------------------------------------------------- Wound Assessment Details Patient Name: [name], [name] Date of Service:  05/21/2015 8:45 AM Medical Record Number: [id number] Patient Account Number: [id number] Date of Birth/Sex: May 24, 1971 (44 y.o. Male) Treating RN: Huel Coventry Primary Care Physician: Mayford Knife, ERIC Other Clinician: Referring Physician: Mayford Knife, ERIC Treating Physician/Extender: Rudene Re in Treatment: 1 Wound Status Wound Number: 3 Primary Etiology: Venous Leg Ulcer Wound Location: Left Lower Leg - Posterior Wound Status: Open Wounding Event: Gradually Appeared Comorbid Sleep Apnea, Rheumatoid History: Arthritis Date Acquired: 01/26/2015 Weeks Of Treatment: 1 Clustered Wound: No Photos Photo Uploaded By: Elliot Gurney, RN, BSN, Kim on 05/21/2015 11:10:57 Wound Measurements Length: (cm) 1.5 Width: (cm) 2 Depth: (cm) 0.1 Area: (cm) 2.356 Volume: (cm) 0.236 % Reduction in Area: -1102% % Reduction in Volume: -1080% Epithelialization: None Wound Description Classification: Partial Thickness Wound Margin: Flat and Intact Exudate Amount: Large Exudate Type: Serous Exudate Color: amber Wound Bed Granulation Amount: None Present (0%) Exposed Structure Necrotic Amount: Large (67-100%) Fascia Exposed: No Necrotic Quality: Adherent Slough Fat Layer Exposed: No Tendon Exposed: No [name], [name] ([id number]) Muscle Exposed: No Joint Exposed: No Bone Exposed: No Limited to Skin Breakdown Periwound Skin Texture Texture Color No Abnormalities Noted: No No Abnormalities Noted: No Callus: No Atrophie Blanche: No Crepitus: No Cyanosis: No Excoriation: No Ecchymosis: No Fluctuance: No Erythema: No Friable: No Hemosiderin Staining: No Induration: No Mottled: No Localized Edema: No Pallor: No Rash: No Rubor: No Scarring: No Moisture No Abnormalities Noted: No Dry / Scaly: No Maceration: No Moist: No Wound Preparation Ulcer Cleansing: Rinsed/Irrigated with Saline Topical Anesthetic Applied: Other: LIDOCAINE 4%, Treatment Notes Wound #3 (Left, Posterior Lower  Leg) 1. Cleansed with: Clean wound with Normal Saline 2. Anesthetic Topical Lidocaine 4% cream to wound bed prior to debridement 3. Peri-wound Care: Barrier cream 4. Dressing Applied: Aquacel Ag 7. Secured with [employer] Notes XTRA SORB Electronic Signature(s) Signed: 05/21/2015 4:23:51 PM By: Elliot Gurney, RN, BSN, Kim RN, BSN Entered By: Elliot Gurney, RN, BSN, Kim on 05/21/2015 09:03:49 Linnen, [name] ([id number]) Macken, [name] ([id number]) -------------------------------------------------------------------------------- Vitals Details Patient Name: Oscar Mirza C. Date of Service: 05/21/2015 8:45 AM Medical Record Number: [id number] Patient Account Number: [id number] Date of Birth/Sex: December 08, 1970 (44 y.o. Male) Treating RN: Huel Coventry Primary Care Physician: Mayford Knife, ERIC Other Clinician: Referring Physician: Mayford Knife, ERIC Treating Physician/Extender: Rudene Re in Treatment: 1 Vital Signs Time Taken: 08:46 Temperature (F): 98.5 Height (in): 72 Pulse (bpm): 82 Weight (lbs): 386 Respiratory Rate (breaths/min): 18 Body  Mass Index (BMI): 52.3 Blood Pressure (mmHg): 126/60 Reference Range: 80 - 120 mg / dl Electronic Signature(s) Signed: 05/21/2015 4:23:51 PM By: Elliot Gurney, RN, BSN, Kim RN, BSN Entered By: Elliot Gurney, RN, BSN, Kim on 05/21/2015 08:53:35

## 2015-05-28 ENCOUNTER — Encounter: Payer: No Typology Code available for payment source | Attending: Surgery | Admitting: Surgery

## 2015-05-28 DIAGNOSIS — L97212 Non-pressure chronic ulcer of right calf with fat layer exposed: Secondary | ICD-10-CM | POA: Diagnosis present

## 2015-05-28 DIAGNOSIS — I890 Lymphedema, not elsewhere classified: Secondary | ICD-10-CM | POA: Diagnosis not present

## 2015-05-28 DIAGNOSIS — L97222 Non-pressure chronic ulcer of left calf with fat layer exposed: Secondary | ICD-10-CM | POA: Diagnosis not present

## 2015-05-28 DIAGNOSIS — I87313 Chronic venous hypertension (idiopathic) with ulcer of bilateral lower extremity: Secondary | ICD-10-CM | POA: Insufficient documentation

## 2015-05-29 DIAGNOSIS — L97212 Non-pressure chronic ulcer of right calf with fat layer exposed: Secondary | ICD-10-CM | POA: Diagnosis not present

## 2015-05-30 NOTE — Progress Notes (Signed)
BRONSYN, SHAPPELL ([id number]) Visit Report for 05/29/2015 Arrival Information Details Patient Name: Oscar, [name]. Date of Service: 05/29/2015 3:45 PM Medical Record Number: [id number] Patient Account Number: [id number] Date of Birth/Sex: 26-Jul-1971 (44 y.o. Male) Treating RN: Clover Mealy, RN, BSN, [place] Sink Primary Care Physician: Mayford Knife, ERIC Other Clinician: Referring Physician: Mayford Knife, ERIC Treating Physician/Extender: Rudene Re in Treatment: 2 Visit Information History Since Last Visit Any new allergies or adverse reactions: No Patient Arrived: Ambulatory Had a fall or experienced change in No Arrival Time: 15:55 activities of daily living that may affect Accompanied By: wife risk of falls: Transfer Assistance: None Signs or symptoms of abuse/neglect since last No Patient Identification Verified: Yes visito Secondary Verification Process Yes Hospitalized since last visit: No Completed: Has Dressing in Place as Prescribed: Yes Patient Has Alerts: Yes Has Compression in Place as Prescribed: No Patient Alerts: Patient on Blood Pain Present Now: No Thinner Electronic Signature(s) Signed: 05/29/2015 5:01:14 PM By: Elpidio Eric BSN, RN Entered By: Elpidio Eric on 05/29/2015 16:14:19 Getter, [name] ([id number]) -------------------------------------------------------------------------------- Encounter Discharge Information Details Patient Name: Oscar Mirza C. Date of Service: 05/29/2015 3:45 PM Medical Record Number: [id number] Patient Account Number: [id number] Date of Birth/Sex: [birth year] (44 y.o. Male) Treating RN: Clover Mealy, RN, BSN, [place] Sink Primary Care Physician: Mayford Knife, ERIC Other Clinician: Referring Physician: Mayford Knife, ERIC Treating Physician/Extender: Rudene Re in Treatment: 2 Encounter Discharge Information Items Discharge Pain Level: 0 Discharge Condition: Stable Ambulatory Status: Ambulatory Discharge Destination:  Home Private Transportation: Auto Accompanied By: wife Schedule Follow-up Appointment: No Medication Reconciliation completed and No provided to Patient/Care Tia Hieronymus: Clinical Summary of Care: Electronic Signature(s) Signed: 05/29/2015 5:01:14 PM By: Elpidio Eric BSN, RN Entered By: Elpidio Eric on 05/29/2015 16:15:14 Occhipinti, [name] ([id number]) -------------------------------------------------------------------------------- Patient/Caregiver Education Details Patient Name: Oscar Civil. Date of Service: 05/29/2015 3:45 PM Medical Record Number: [id number] Patient Account Number: [id number] Date of Birth/Gender: 15-Jun-1971 (44 y.o. Male) Treating RN: Clover Mealy, RN, BSN, [place] Sink Primary Care Physician: Mayford Knife, ERIC Other Clinician: Referring Physician: Mayford Knife, ERIC Treating Physician/Extender: Rudene Re in Treatment: 2 Education Assessment Education Provided To: Patient Education Topics Provided Welcome To The Wound Care Center: Methods: Explain/Verbal Responses: State content correctly Electronic Signature(s) Signed: 05/29/2015 5:01:14 PM By: Elpidio Eric BSN, RN Entered By: Elpidio Eric on 05/29/2015 16:15:00

## 2015-05-31 NOTE — Progress Notes (Signed)
[name], Oscar ([id number]) Visit Report for 05/28/2015 Arrival Information Details Patient Name: [name], [name]. Date of Service: 05/28/2015 8:15 AM Medical Record Number: [id number] Patient Account Number: [id number] Date of Birth/Sex: 06-06-1971 (44 y.o. Male) Treating RN: Huel Coventry Primary Care Physician: Mayford Knife, ERIC Other Clinician: Referring Physician: Mayford Knife, ERIC Treating Physician/Extender: Rudene Re in Treatment: 2 Visit Information History Since Last Visit Added or deleted any medications: No Patient Arrived: Ambulatory Any new allergies or adverse reactions: No Arrival Time: 08:17 Had a fall or experienced change in No Accompanied By: wife activities of daily living that may affect Transfer Assistance: None risk of falls: Patient Identification Verified: Yes Signs or symptoms of abuse/neglect since last No Secondary Verification Process Yes visito Completed: Hospitalized since last visit: No Patient Has Alerts: Yes Has Dressing in Place as Prescribed: Yes Patient Alerts: Patient on Blood Has Compression in Place as Prescribed: Yes Thinner Pain Present Now: No Electronic Signature(s) Signed: 05/30/2015 4:58:31 PM By: Elliot Gurney, RN, BSN, Kim RN, BSN Entered By: Elliot Gurney, RN, BSN, Kim on 05/28/2015 08:19:33 Goon, [name] ([id number]) -------------------------------------------------------------------------------- Encounter Discharge Information Details Patient Name: Oscar Mirza C. Date of Service: 05/28/2015 8:15 AM Medical Record Number: [id number] Patient Account Number: [id number] Date of Birth/Sex: 1971-07-09 (44 y.o. Male) Treating RN: Huel Coventry Primary Care Physician: Mayford Knife, ERIC Other Clinician: Referring Physician: Mayford Knife, ERIC Treating Physician/Extender: Rudene Re in Treatment: 2 Encounter Discharge Information Items Discharge Pain Level: 0 Discharge Condition: Stable Ambulatory Status: Ambulatory Discharge Destination:  Home Private Transportation: Auto Accompanied By: wife Schedule Follow-up Appointment: Yes Medication Reconciliation completed and Yes provided to Patient/Care Laural Eiland: Clinical Summary of Care: Electronic Signature(s) Signed: 05/30/2015 4:58:31 PM By: Elliot Gurney, RN, BSN, Kim RN, BSN Entered By: Elliot Gurney, RN, BSN, Kim on 05/28/2015 09:04:35 Labine, [name] ([id number]) -------------------------------------------------------------------------------- Lower Extremity Assessment Details Patient Name: [name], [name] Date of Service: 05/28/2015 8:15 AM Medical Record Number: [id number] Patient Account Number: [id number] Date of Birth/Sex: [birth year] (44 y.o. Male) Treating RN: Huel Coventry Primary Care Physician: Mayford Knife, ERIC Other Clinician: Referring Physician: Mayford Knife, ERIC Treating Physician/Extender: Rudene Re in Treatment: 2 Edema Assessment Assessed: [Left: No] [Right: No] E[Left: dema] [Right: :] Calf Left: Right: Point of Measurement: 33 cm From Medial Instep 46 cm 46.5 cm Ankle Left: Right: Point of Measurement: 11 cm From Medial Instep 31.5 cm 30.5 cm Vascular Assessment Pulses: Posterior Tibial Dorsalis Pedis Palpable: [Left:Yes] [Right:Yes] Extremity colors, hair growth, and conditions: Extremity Color: [Left:Dusky] [Right:Hyperpigmented] Hair Growth on Extremity: [Left:Yes] [Right:No] Temperature of Extremity: [Left:Warm] [Right:Warm] Capillary Refill: [Left:< 3 seconds] [Right:< 3 seconds] Toe Nail Assessment Left: Right: Thick: Yes Yes Discolored: Yes Yes Deformed: Yes Yes Improper Length and Hygiene: Yes Yes Electronic Signature(s) Signed: 05/30/2015 4:58:31 PM By: Elliot Gurney, RN, BSN, Kim RN, BSN Entered By: Elliot Gurney, RN, BSN, Kim on 05/28/2015 08:27:25 Denbleyker, [name] ([id number]) -------------------------------------------------------------------------------- Multi Wound Chart Details Patient Name: [name], [name] Date of Service: 05/28/2015 8:15  AM Medical Record Number: [id number] Patient Account Number: [id number] Date of Birth/Sex: Dec 08, 1970 (44 y.o. Male) Treating RN: Huel Coventry Primary Care Physician: Mayford Knife, ERIC Other Clinician: Referring Physician: Mayford Knife, ERIC Treating Physician/Extender: Rudene Re in Treatment: 2 Vital Signs Height(in): 72 Pulse(bpm): 79 Weight(lbs): 386 Blood Pressure 118/68 (mmHg): Body Mass Index(BMI): 52 Temperature(F): 98.8 Respiratory Rate 18 (breaths/min): Photos: [1:No Photos] [2:No Photos] [3:No Photos] Wound Location: [1:Right Lower Leg - Lateral Left, Lateral Lower Leg] [3:Left, Posterior Lower Leg] Wounding Event: [1:Gradually Appeared] [2:Gradually Appeared] [3:Gradually Appeared] Primary Etiology: [1:Venous Leg Ulcer] [2:Venous  Leg Ulcer] [3:Venous Leg Ulcer] Comorbid History: [1:Sleep Apnea, Rheumatoid N/A Arthritis] [3:N/A] Date Acquired: [1:01/26/2015] [2:01/26/2015] [3:01/26/2015] Weeks of Treatment: [1:2] [2:2] [3:2] Wound Status: [1:Open] [2:Open] [3:Open] Measurements L x W x D 2.2x2.8x0.1 [2:0.1x0.1x0.1] [3:0.2x0.1x0.1] (cm) Area (cm) : [1:4.838] [2:0.008] [3:0.016] Volume (cm) : [1:0.484] [2:0.001] [3:0.002] % Reduction in Area: [1:51.10%] [2:100.00%] [3:91.80%] % Reduction in Volume: 51.10% [2:100.00%] [3:90.00%] Classification: [1:Full Thickness Without Exposed Support Structures] [2:Partial Thickness] [3:Partial Thickness] Exudate Amount: [1:Large] [2:N/A] [3:N/A] Exudate Type: [1:Serous] [2:N/A] [3:N/A] Exudate Color: [1:amber] [2:N/A] [3:N/A] Wound Margin: [1:Indistinct, nonvisible] [2:N/A] [3:N/A] Granulation Amount: [1:Small (1-33%)] [2:N/A] [3:N/A] Granulation Quality: [1:Pink, Hyper-granulation N/A] [3:N/A] Necrotic Amount: [1:Large (67-100%)] [2:N/A] [3:N/A] Exposed Structures: [1:Fascia: No Fat: No Tendon: No Muscle: No Joint: No] [2:N/A] [3:N/A] Bone: No Limited to Skin Breakdown Epithelialization: Small (1-33%) N/A N/A Periwound Skin  Texture: No Abnormalities Noted No Abnormalities Noted No Abnormalities Noted Periwound Skin Maceration: Yes No Abnormalities Noted No Abnormalities Noted Moisture: Moist: Yes Periwound Skin Color: No Abnormalities Noted No Abnormalities Noted No Abnormalities Noted Temperature: No Abnormality N/A N/A Tenderness on Yes No No Palpation: Wound Preparation: Ulcer Cleansing: N/A N/A Rinsed/Irrigated with Saline Topical Anesthetic Applied: Other: lidociane 4% Treatment Notes Electronic Signature(s) Signed: 05/30/2015 4:58:31 PM By: Elliot Gurney, RN, BSN, Kim RN, BSN Entered By: Elliot Gurney, RN, BSN, Kim on 05/28/2015 08:36:34 Divito, [name] ([id number]) -------------------------------------------------------------------------------- Multi-Disciplinary Care Plan Details Patient Name: Birchard, Naseem C. Date of Service: 05/28/2015 8:15 AM Medical Record Number: [id number] Patient Account Number: [id number] Date of Birth/Sex: 03/10/1971 (44 y.o. Male) Treating RN: Huel Coventry Primary Care Physician: Mayford Knife, ERIC Other Clinician: Referring Physician: Mayford Knife, ERIC Treating Physician/Extender: Rudene Re in Treatment: 2 Active Inactive Orientation to the Wound Care Program Nursing Diagnoses: Knowledge deficit related to the wound healing center program Goals: Patient/caregiver will verbalize understanding of the Wound Healing Center Program Date Initiated: 05/14/2015 Goal Status: Active Interventions: Provide education on orientation to the wound center Notes: Venous Leg Ulcer Nursing Diagnoses: Potential for venous Insuffiency (use before diagnosis confirmed) Goals: Non-invasive venous studies are completed as ordered Date Initiated: 05/14/2015 Goal Status: Active Interventions: Assess peripheral edema status every visit. Treatment Activities: Non-invasive vascular studies : 05/28/2015 Notes: Wound/Skin Impairment Nursing Diagnoses: Impaired tissue integrity Darnold, Jermon C.  ([id number]) Goals: Patient/caregiver will verbalize understanding of skin care regimen Date Initiated: 05/14/2015 Goal Status: Active Ulcer/skin breakdown will heal within 14 weeks Date Initiated: 05/14/2015 Goal Status: Active Interventions: Assess patient/caregiver ability to obtain necessary supplies Notes: Electronic Signature(s) Signed: 05/30/2015 4:58:31 PM By: Elliot Gurney, RN, BSN, Kim RN, BSN Entered By: Elliot Gurney, RN, BSN, Kim on 05/28/2015 08:36:29 Whetzel, [name] ([id number]) -------------------------------------------------------------------------------- Pain Assessment Details Patient Name: Cajas, Jaiveon C. Date of Service: 05/28/2015 8:15 AM Medical Record Number: [id number] Patient Account Number: [id number] Date of Birth/Sex: 1971/08/08 (44 y.o. Male) Treating RN: Huel Coventry Primary Care Physician: Mayford Knife, ERIC Other Clinician: Referring Physician: Mayford Knife, ERIC Treating Physician/Extender: Rudene Re in Treatment: 2 Active Problems Location of Pain Severity and Description of Pain Patient Has Paino No Site Locations Pain Management and Medication Current Pain Management: Electronic Signature(s) Signed: 05/30/2015 4:58:31 PM By: Elliot Gurney, RN, BSN, Kim RN, BSN Entered By: Elliot Gurney, RN, BSN, Kim on 05/28/2015 08:19:38 Hamberger, [name] ([id number]) -------------------------------------------------------------------------------- Patient/Caregiver Education Details Patient Name: [name]. Date of Service: 05/28/2015 8:15 AM Medical Record Number: [id number] Patient Account Number: [id number] Date of Birth/Gender: 1971-06-01 (44 y.o. Male) Treating RN: Huel Coventry Primary Care Physician: Mayford Knife, ERIC Other Clinician: Referring Physician: Mayford Knife, ERIC Treating Physician/Extender: Evlyn Kanner  Weeks in Treatment: 2 Education Assessment Education Provided To: Patient Education Topics Provided Venous: Handouts: Controlling Swelling with Multilayered Compression  Wraps, Other: Elevate legs, C-pap; wraps Methods: Demonstration, Printed Responses: State content correctly Electronic Signature(s) Signed: 05/30/2015 4:58:31 PM By: Elliot Gurney, RN, BSN, Kim RN, BSN Entered By: Elliot Gurney, RN, BSN, Kim on 05/28/2015 09:05:16 Gatchel, [name] ([id number]) -------------------------------------------------------------------------------- Wound Assessment Details Patient Name: Setzler, Simona Huh C. Date of Service: 05/28/2015 8:15 AM Medical Record Number: [id number] Patient Account Number: [id number] Date of Birth/Sex: 1971-10-16 (44 y.o. Male) Treating RN: Huel Coventry Primary Care Physician: Mayford Knife, ERIC Other Clinician: Referring Physician: Mayford Knife, ERIC Treating Physician/Extender: Rudene Re in Treatment: 2 Wound Status Wound Number: 1 Primary Etiology: Venous Leg Ulcer Wound Location: Right Lower Leg - Lateral Wound Status: Open Wounding Event: Gradually Appeared Comorbid Sleep Apnea, Rheumatoid History: Arthritis Date Acquired: 01/26/2015 Weeks Of Treatment: 2 Clustered Wound: No Photos Photo Uploaded By: Elliot Gurney, RN, BSN, Kim on 05/28/2015 10:04:38 Wound Measurements Length: (cm) 2.2 Width: (cm) 2.8 Depth: (cm) 0.1 Area: (cm) 4.838 Volume: (cm) 0.484 % Reduction in Area: 51.1% % Reduction in Volume: 51.1% Epithelialization: Small (1-33%) Wound Description Full Thickness Without Exposed Classification: Support Structures Wound Margin: Indistinct, nonvisible Exudate Large Amount: Exudate Type: Serous Exudate Color: amber Wound Bed Granulation Amount: Small (1-33%) Exposed Structure Granulation Quality: Pink, Hyper-granulation Fascia Exposed: No Necrotic Amount: Large (67-100%) Fat Layer Exposed: No Burgner, Valmore C. ([id number]) Necrotic Quality: Adherent Slough Tendon Exposed: No Muscle Exposed: No Joint Exposed: No Bone Exposed: No Limited to Skin Breakdown Periwound Skin Texture Texture Color No Abnormalities Noted: No No  Abnormalities Noted: No Moisture Temperature / Pain No Abnormalities Noted: No Temperature: No Abnormality Maceration: Yes Tenderness on Palpation: Yes Moist: Yes Wound Preparation Ulcer Cleansing: Rinsed/Irrigated with Saline Topical Anesthetic Applied: Other: lidociane 4%, Treatment Notes Wound #1 (Right, Lateral Lower Leg) 1. Cleansed with: Cleanse wound with antibacterial soap and water 2. Anesthetic Topical Lidocaine 4% cream to wound bed prior to debridement 3. Peri-wound Care: Barrier cream 4. Dressing Applied: Aquacel Ag Other dressing (specify in notes) 7. Secured with [employer] Bilaterally Notes XTRA SORB Electronic Signature(s) Signed: 05/30/2015 4:58:31 PM By: Elliot Gurney, RN, BSN, Kim RN, BSN Entered By: Elliot Gurney, RN, BSN, Kim on 05/28/2015 08:33:17 Mutchler, [name] ([id number]) -------------------------------------------------------------------------------- Wound Assessment Details Patient Name: Goosby, Eunice C. Date of Service: 05/28/2015 8:15 AM Medical Record Number: [id number] Patient Account Number: [id number] Date of Birth/Sex: [birth year] (44 y.o. Male) Treating RN: Huel Coventry Primary Care Physician: Mayford Knife, ERIC Other Clinician: Referring Physician: Mayford Knife, ERIC Treating Physician/Extender: Rudene Re in Treatment: 2 Wound Status Wound Number: 2 Primary Etiology: Venous Leg Ulcer Wound Location: Left, Lateral Lower Leg Wound Status: Open Wounding Event: Gradually Appeared Date Acquired: 01/26/2015 Weeks Of Treatment: 2 Clustered Wound: No Photos Photo Uploaded By: Elliot Gurney, RN, BSN, Kim on 05/28/2015 10:04:38 Wound Measurements Length: (cm) 0.1 Width: (cm) 0.1 Depth: (cm) 0.1 Area: (cm) 0.008 Volume: (cm) 0.001 % Reduction in Area: 100% % Reduction in Volume: 100% Wound Description Classification: Partial Thickness Periwound Skin Texture Texture Color No Abnormalities Noted: No No Abnormalities Noted: No Moisture No Abnormalities  Noted: No Treatment Notes Wound #2 (Left, Lateral Lower Leg) 1. Cleansed with: Kooyman, Marlos C. ([id number]) Clean wound with Normal Saline Cleanse wound with antibacterial soap and water 3. Peri-wound Care: Barrier cream 5. Secondary Dressing Applied ABD Pad 7. Secured with Administrator, [employer]) Signed: 05/30/2015 4:58:31 PM By: Elliot Gurney, RN, BSN, Kim RN, BSN Entered By: Elliot Gurney, RN, BSN, Kim  on 05/28/2015 08:33:04 Marcos, Jamarquis C. ([id number]) -------------------------------------------------------------------------------- Wound Assessment Details Patient Name: Zaragosa, Damarkus C. Date of Service: 05/28/2015 8:15 AM Medical Record Number: [id number] Patient Account Number: [id number] Date of Birth/Sex: 1970/11/08 (44 y.o. Male) Treating RN: Huel Coventry Primary Care Physician: Mayford Knife, ERIC Other Clinician: Referring Physician: Mayford Knife, ERIC Treating Physician/Extender: Rudene Re in Treatment: 2 Wound Status Wound Number: 3 Primary Etiology: Venous Leg Ulcer Wound Location: Left, Posterior Lower Leg Wound Status: Open Wounding Event: Gradually Appeared Date Acquired: 01/26/2015 Weeks Of Treatment: 2 Clustered Wound: No Photos Photo Uploaded By: Elliot Gurney, RN, BSN, Kim on 05/28/2015 10:04:47 Wound Measurements Length: (cm) 0.2 Width: (cm) 0.1 Depth: (cm) 0.1 Area: (cm) 0.016 Volume: (cm) 0.002 % Reduction in Area: 91.8% % Reduction in Volume: 90% Wound Description Classification: Partial Thickness Periwound Skin Texture Texture Color No Abnormalities Noted: No No Abnormalities Noted: No Moisture No Abnormalities Noted: No Treatment Notes Wound #3 (Left, Posterior Lower Leg) 1. Cleansed with: Rousseau, Delando C. ([id number]) Clean wound with Normal Saline Cleanse wound with antibacterial soap and water 3. Peri-wound Care: Barrier cream 5. Secondary Dressing Applied ABD Pad 7. Secured with Editor, [employer]ing) Signed: 05/30/2015 4:58:31 PM By: Elliot Gurney, RN, BSN, Kim RN, BSN Entered By: Elliot Gurney, RN, BSN, Kim on 05/28/2015 08:33:04 Pretty, [name] ([id number]) -------------------------------------------------------------------------------- Vitals Details Patient Name: Oscar Mirza C. Date of Service: 05/28/2015 8:15 AM Medical Record Number: [id number] Patient Account Number: [id number] Date of Birth/Sex: 04-19-1971 (44 y.o. Male) Treating RN: Huel Coventry Primary Care Physician: Mayford Knife, ERIC Other Clinician: Referring Physician: Mayford Knife, ERIC Treating Physician/Extender: Rudene Re in Treatment: 2 Vital Signs Time Taken: 08:19 Temperature (F): 98.8 Height (in): 72 Pulse (bpm): 79 Weight (lbs): 386 Respiratory Rate (breaths/min): 18 Body Mass Index (BMI): 52.3 Blood Pressure (mmHg): 118/68 Reference Range: 80 - 120 mg / dl Electronic Signature(s) Signed: 05/30/2015 4:58:31 PM By: Elliot Gurney, RN, BSN, Kim RN, BSN Entered By: Elliot Gurney, RN, BSN, Kim on 05/28/2015 25:00:37

## 2015-05-31 NOTE — Progress Notes (Signed)
[name], [name] ([id number]) Visit Report for 05/28/2015 Chief Complaint Document Details Patient Name: [name]. Date of Service: 05/28/2015 8:15 AM Medical Record Number: [id number] Patient Account Number: [id number] Date of Birth/Sex: [birth year] (44 y.o. Male) Treating RN: Primary Care Physician: [name] Other Clinician: Referring Physician: Mayford Knife, [name] Treating Physician/Extender: [name] in Treatment: 2 Information Obtained from: Patient Chief Complaint Patient presents for treatment of an open ulcer due to venous insufficiency. 44 year old gentleman who has bilateral lower leg swelling and ulceration for the last 3 months. Electronic Signature(s) Signed: 05/28/2015 8:48:58 AM By: [name] Entered By: Oscar Kanner on 05/28/2015 08:48:58 [name] ([id number]) -------------------------------------------------------------------------------- HPI Details Patient Name: Oscar Mirza C. Date of Service: 05/28/2015 8:15 AM Medical Record Number: [id number] Patient Account Number: [id number] Date of Birth/Sex: 10-01-1971 (44 y.o. Male) Treating RN: Primary Care Physician: [name] Other Clinician: Referring Physician: Mayford Knife, [name] Treating Physician/Extender: [name] in Treatment: 2 History of Present Illness Location: bilateral lower extremity swelling and ulceration Quality: Patient reports experiencing a dull pain to affected area(s). Severity: Patient states wound are getting worse. Duration: Patient has had the wound for > 3 months prior to seeking treatment at the wound center Timing: Pain in wound is Intermittent (comes and goes Context: The wound appeared gradually over time Modifying Factors: Other treatment(s) tried include: has seen his PCP and has been on Keflex. Associated Signs and Symptoms: Patient reports having difficulty standing for long periods. HPI Description: This 44 year old gentleman has been  referred from his PCPs office where he was seen by [name]. The patient is known to be seen most recently for morbidly obese, sleep apnea, chronic lower extremity edema, osteoarthritis, pain in the legs. His past medical history is significant for chronic venous insufficiency, pain in the back, morbid obesity, cardiomegaly, osteoarthritis of the hip, sleep apnea, kidney stones, hematuria, ulcers both lower extremities. He had a left hip arthroplasty in April of this year. He has never had venous duplex studies done on his lower extremities though he has been to the wound center several years ago. He has also not been wearing his compression stockings. 05/21/2015. He has been compliant with elevation of the limb and wearing his sleep apnea machine so that he can breathe better and try and get to sleep in a bed. His vascular studies as scheduled for early August. 05/28/2015 -- he has his vascular appointment tomorrow afternoon and other than that has been doing very well. Electronic Signature(s) Signed: 05/28/2015 8:49:19 AM By: [name] Entered By: Oscar Kanner on 05/28/2015 08:49:19 [name], [name] -------------------------------------------------------------------------------- Physical Exam Details Patient Name: [name], [name] Date of Service: 05/28/2015 8:15 AM Medical Record Number: [id number] Patient Account Number: [id number] Date of Birth/Sex: [birth year] (44 y.o. Male) Treating RN: Primary Care Physician: [name] Other Clinician: Referring Physician: Mayford Knife, [name] Treating Physician/Extender: [name] in Treatment: 2 Constitutional . Pulse regular. Respirations normal and unlabored. Afebrile. . Eyes Nonicteric. Reactive to light. Ears, Nose, Mouth, and Throat Lips, teeth, and gums WNL.Marland Kitchen Moist mucosa without lesions . Neck supple and nontender. No palpable supraclavicular or cervical adenopathy. Normal sized without  goiter. Respiratory WNL. No retractions.. Breath sounds WNL, No rubs, rales, rhonchi, or wheeze.. Cardiovascular Heart rhythm and rate regular, no murmur or gallop.. Pedal Pulses WNL. No clubbing, cyanosis or edema. Lymphatic No adneopathy. No adenopathy. No adenopathy. Musculoskeletal Adexa without tenderness or enlargement.. Digits and nails w/o clubbing, cyanosis, infection, petechiae, ischemia, or inflammatory  conditions.. Integumentary (Hair, Skin) No suspicious lesions. No crepitus or fluctuance. No peri-wound warmth or erythema. No masses.Marland Kitchen Psychiatric Judgement and insight Intact.. No evidence of depression, anxiety, or agitation.. Notes The left leg looks very good and has minimal open area. The right leg is much smaller than before and has minimal slough which is easily washed off with a saline gauze. Electronic Signature(s) Signed: 05/28/2015 8:50:00 AM By: [name] Entered By: Oscar Kanner on 05/28/2015 08:50:00 [name] ([id number]) -------------------------------------------------------------------------------- Physician Orders Details Patient Name: Oscar Mirza C. Date of Service: 05/28/2015 8:15 AM Medical Record Number: [id number] Patient Account Number: [id number] Date of Birth/Sex: 1971-08-14 (44 y.o. Male) Treating RN: Huel Coventry Primary Care Physician: [name] Other Clinician: Referring Physician: Mayford Knife, [name] Treating Physician/Extender: [name] in Treatment: 2 Verbal / Phone Orders: Yes Clinician: Huel Coventry Read Back and Verified: Yes Diagnosis Coding Wound Cleansing Wound #1 Right,Lateral Lower Leg o Clean wound with Normal Saline. Wound #2 Left,Lateral Lower Leg o Clean wound with Normal Saline. Wound #3 Left,Posterior Lower Leg o Clean wound with Normal Saline. Anesthetic Wound #1 Right,Lateral Lower Leg o Topical Lidocaine 4% cream applied to wound bed prior to debridement Skin  Barriers/Peri-Wound Care Wound #1 Right,Lateral Lower Leg o Barrier cream Wound #2 Left,Lateral Lower Leg o Barrier cream Wound #3 Left,Posterior Lower Leg o Barrier cream Primary Wound Dressing Wound #1 Right,Lateral Lower Leg o Aquacel Ag Secondary Dressing Wound #1 Right,Lateral Lower Leg o XtraSorb Dressing Change Frequency Wound #1 Right,Lateral Lower Leg o Change dressing every week [name], [name] ([id number]) Wound #2 Left,Lateral Lower Leg o Change dressing every week Wound #3 Left,Posterior Lower Leg o Change dressing every week Follow-up Appointments Wound #1 Right,Lateral Lower Leg o Return Appointment in 1 week. o Nurse Visit as needed - wrap after vascular apt if needed Wound #2 Left,Lateral Lower Leg o Return Appointment in 1 week. o Nurse Visit as needed - wrap after vascular apt if needed Wound #3 Left,Posterior Lower Leg o Return Appointment in 1 week. o Nurse Visit as needed - wrap after vascular apt if needed Edema Control Wound #1 Right,Lateral Lower Leg o Unna Boots Bilaterally o Elevate legs to the level of the heart and pump ankles as often as possible Wound #2 Left,Lateral Lower Leg o Unna Boots Bilaterally o Elevate legs to the level of the heart and pump ankles as often as possible Wound #3 Left,Posterior Lower Leg o Unna Boots Bilaterally o Elevate legs to the level of the heart and pump ankles as often as possible Additional Orders / Instructions Wound #1 Right,Lateral Lower Leg o Increase protein intake. o OK to return to work o Activity as tolerated o Other: - C-pap to be used every night Wound #2 Left,Lateral Lower Leg o Increase protein intake. o OK to return to work o Activity as tolerated o Other: - C-pap to be used every night Wound #3 Left,Posterior Lower Leg o Increase protein intake. [name] ([id number]) o OK to return to work o Activity as  tolerated o Other: - C-pap to be used every night Notes (L) 47 to knee; (R) 48 to knee Electronic Signature(s) Signed: 05/28/2015 12:31:06 PM By: [name] Signed: 05/30/2015 4:58:31 PM By: Elliot Gurney RN, BSN, Kim RN, BSN Entered By: Elliot Gurney, RN, BSN, Kim on 05/28/2015 08:47:35 [name] ([id number]) -------------------------------------------------------------------------------- Problem List Details Patient Name: [name], [name] Date of Service: 05/28/2015 8:15 AM Medical Record Number: [id number] Patient Account Number: [id number] Date  of Birth/Sex: 1970/11/13 (44 y.o. 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 Date of Service: 05/28/2015 8:15 AM Medical Record Number: [id number] Patient Account Number: [id number] Date of Birth/Sex: November 07, 1970 (44 y.o. Male) Treating RN: Primary Care Physician: [name] Other Clinician: Referring Physician: Mayford Knife, [name] Treating Physician/Extender: [name] in Treatment: 2 Subjective Chief  Complaint Information obtained from Patient Patient presents for treatment of an open ulcer due to venous insufficiency. 44 year old gentleman who has bilateral lower leg swelling and ulceration for the last 3 months. History of Present Illness (HPI) The following HPI elements were documented for the patient's wound: Location: bilateral lower extremity swelling and ulceration Quality: Patient reports experiencing a dull pain to affected area(s). Severity: Patient states wound are getting worse. Duration: Patient has had the wound for > 3 months prior to seeking treatment at the wound center Timing: Pain in wound is Intermittent (comes and goes Context: The wound appeared gradually over time Modifying Factors: Other treatment(s) tried include: has seen his PCP and has been on Keflex. Associated Signs and Symptoms: Patient reports having difficulty standing for long periods. This 44 year old gentleman has been referred from his PCPs office where he was seen by [name]. The patient is known to be seen most recently for morbidly obese, sleep apnea, chronic lower extremity edema, osteoarthritis, pain in the legs. His past medical history is significant for chronic venous insufficiency, pain in the back, morbid obesity, cardiomegaly, osteoarthritis of the hip, sleep apnea, kidney stones, hematuria, ulcers both lower extremities. He had a left hip arthroplasty in April of this year. He has never had venous duplex studies done on his lower extremities though he has been to the wound center several years ago. He has also not been wearing his compression stockings. 05/21/2015. He has been compliant with elevation of the limb and wearing his sleep apnea machine so that he can breathe better and try and get to sleep in a bed. His vascular studies as scheduled for early August. 05/28/2015 -- he has his vascular appointment tomorrow afternoon and other than that has been doing  very well. [name], [name] ([id number]) Objective Constitutional Pulse regular. Respirations normal and unlabored. Afebrile. Vitals Time Taken: 8:19 AM, Height: 72 in, Weight: 386 lbs, BMI: 52.3, Temperature: 98.8 F, Pulse: 79 bpm, Respiratory Rate: 18 breaths/min, Blood Pressure: 118/68 mmHg. Eyes Nonicteric. Reactive to light. Ears, Nose, Mouth, and Throat Lips, teeth, and gums WNL.Marland Kitchen Moist mucosa without lesions . Neck supple and nontender. No palpable supraclavicular or cervical adenopathy. Normal sized without goiter. Respiratory WNL. No retractions.. Breath sounds WNL, No rubs, rales, rhonchi, or wheeze.. Cardiovascular Heart rhythm and rate regular, no murmur or gallop.. Pedal Pulses WNL. No clubbing, cyanosis or edema. Lymphatic No adneopathy. No adenopathy. No adenopathy. Musculoskeletal Adexa without tenderness or enlargement.. Digits and nails w/o clubbing, cyanosis,  infection, petechiae, ischemia, or inflammatory conditions.Marland Kitchen Psychiatric Judgement and insight Intact.. No evidence of depression, anxiety, or agitation.. General Notes: The left leg looks very good and has minimal open area. The right leg is much smaller than before and has minimal slough which is easily washed off with a saline gauze. Integumentary (Hair, Skin) No suspicious lesions. No crepitus or fluctuance. No peri-wound warmth or erythema. No masses.. Wound #1 status is Open. Original cause of wound was Gradually Appeared. The wound is located on the Right,Lateral Lower Leg. The wound measures 2.2cm length x 2.8cm width x 0.1cm depth; 4.838cm^2 area and 0.484cm^3 volume. The wound is limited to skin breakdown. There is a large amount of serous drainage noted. The wound margin is indistinct and nonvisible. There is small (1-33%) pink granulation within the wound bed. There is a large (67-100%) amount of necrotic tissue within the wound bed including Adherent Slough. The periwound skin appearance  exhibited: Maceration, Moist. Periwound temperature was noted as No Abnormality. The periwound has tenderness on palpation. [name], [name] ([id number]) Wound #2 status is Open. Original cause of wound was Gradually Appeared. The wound is located on the Left,Lateral Lower Leg. The wound measures 0.1cm length x 0.1cm width x 0.1cm depth; 0.008cm^2 area and 0.001cm^3 volume. Wound #3 status is Open. Original cause of wound was Gradually Appeared. The wound is located on the Left,Posterior Lower Leg. The wound measures 0.2cm length x 0.1cm width x 0.1cm depth; 0.016cm^2 area and 0.002cm^3 volume. Assessment Active Problems ICD-10 I87.313 - Chronic venous hypertension (idiopathic) with ulcer of bilateral lower extremity L97.212 - Non-pressure chronic ulcer of right calf with fat layer exposed L97.222 - Non-pressure chronic ulcer of left calf with fat layer exposed E66.01 - Morbid (severe) obesity due to excess calories I89.0 - Lymphedema, not elsewhere classified In anticipation of his left leg completely healing we have ordered Jobst stockings 20-30 mmHg. We'll continue with silver alginate and bilateral Unna's boots this week. He will come for a [name]- wrapping in case they remove his Unna's tomorrow for a venous duplex study and they do not wrap it at the vascular office. He will come back to see me next week Plan Wound Cleansing: Wound #1 Right,Lateral Lower Leg: Clean wound with Normal Saline. Wound #2 Left,Lateral Lower Leg: Clean wound with Normal Saline. Wound #3 Left,Posterior Lower Leg: Clean wound with Normal Saline. Anesthetic: Wound #1 Right,Lateral Lower Leg: Topical Lidocaine 4% cream applied to wound bed prior to debridement Skin Barriers/Peri-Wound Care: Wound #1 Right,Lateral Lower Leg: Barrier cream Wound #2 Left,Lateral Lower Leg: [name], [name] ([id number]) Barrier cream Wound #3 Left,Posterior Lower Leg: Barrier cream Primary Wound Dressing: Wound #1  Right,Lateral Lower Leg: Aquacel Ag Secondary Dressing: Wound #1 Right,Lateral Lower Leg: XtraSorb Dressing Change Frequency: Wound #1 Right,Lateral Lower Leg: Change dressing every week Wound #2 Left,Lateral Lower Leg: Change dressing every week Wound #3 Left,Posterior Lower Leg: Change dressing every week Follow-up Appointments: Wound #1 Right,Lateral Lower Leg: Return Appointment in 1 week. Nurse Visit as needed - wrap after vascular apt if needed Wound #2 Left,Lateral Lower Leg: Return Appointment in 1 week. Nurse Visit as needed - wrap after vascular apt if needed Wound #3 Left,Posterior Lower Leg: Return Appointment in 1 week. Nurse Visit as needed - wrap after vascular apt if needed Edema Control: Wound #1 Right,Lateral Lower Leg: Unna Boots Bilaterally Elevate legs to the level of the heart and pump ankles as often as possible Wound #2 Left,Lateral Lower Leg: Unna Boots Bilaterally Elevate legs  to the level of the heart and pump ankles as often as possible Wound #3 Left,Posterior Lower Leg: Unna Boots Bilaterally Elevate legs to the level of the heart and pump ankles as often as possible Additional Orders / Instructions: Wound #1 Right,Lateral Lower Leg: Increase protein intake. OK to return to work Activity as tolerated Other: - C-pap to be used every night Wound #2 Left,Lateral Lower Leg: Increase protein intake. OK to return to work Activity as tolerated Other: - C-pap to be used every night Wound #3 Left,Posterior Lower Leg: Increase protein intake. OK to return to work Activity as tolerated [name] C. ([id number]) Other: - C-pap to be used every night General Notes: (L) 47 to knee; (R) 48 to knee In anticipation of his left leg completely healing we have ordered Jobst stockings 20-30 mmHg. We'll continue with silver alginate and bilateral Unna's boots this week. He will come for a [name]- wrapping in case they remove his Unna's tomorrow for a  venous duplex study and they do not wrap it at the vascular office. He will come back to see me next week Electronic Signature(s) Signed: 05/28/2015 8:52:04 AM By: [name] Entered By: Oscar Kanner on 05/28/2015 08:52:03 [place], [name] ([id number]) -------------------------------------------------------------------------------- SuperBill Details Patient Name: Oscar Mirza C. Date of Service: 05/28/2015 Medical Record Number: [id number] Patient Account Number: [id number] Date of Birth/Sex: 1971/06/27 (43 y.o. Male) Treating RN: Primary Care Physician: [name] Other Clinician: Referring Physician: Mayford Knife, [name] Treating Physician/Extender: [name] in Treatment: 2 Diagnosis Coding ICD-10 Codes Code Description I87.313 Chronic venous hypertension (idiopathic) with ulcer of bilateral lower extremity L97.212 Non-pressure chronic ulcer of right calf with fat layer exposed L97.222 Non-pressure chronic ulcer of left calf with fat layer exposed E66.01 Morbid (severe) obesity due to excess calories I89.0 Lymphedema, not elsewhere classified Facility Procedures CPT4 Code: 63016010 Description: 29580 - APPLY UNNA BOOT/PROFO BILATERAL Modifier: Quantity: 1 Physician Procedures CPT4: Description Modifier Quantity Code [id number] - WC PHYS LEVEL 3 - EST PT 1 ICD-10 Description Diagnosis I87.313 Chronic venous hypertension (idiopathic) with ulcer of bilateral lower extremity L97.212 Non-pressure chronic ulcer of right calf  with fat layer exposed L97.222 Non-pressure chronic ulcer of left calf with fat layer exposed E66.01 Morbid (severe) obesity due to excess calories Electronic Signature(s) Signed: 05/28/2015 12:31:06 PM By: [name] Signed: 05/30/2015 4:58:31 PM By: Elliot Gurney RN, BSN, Kim RN, BSN Previous Signature: 05/28/2015 8:52:21 AM Version By: [name] Entered By: Elliot Gurney RN, BSN, Kim on 05/28/2015 09:03:34

## 2015-06-04 ENCOUNTER — Encounter: Payer: No Typology Code available for payment source | Admitting: Surgery

## 2015-06-04 DIAGNOSIS — L97212 Non-pressure chronic ulcer of right calf with fat layer exposed: Secondary | ICD-10-CM | POA: Diagnosis not present

## 2015-06-04 NOTE — Progress Notes (Signed)
GEORGI, ORD ([id number]) Visit Report for 06/04/2015 Arrival Information Details Patient Name: [name], [name]. Date of Service: 06/04/2015 8:45 AM Medical Record Number: [id number] Patient Account Number: [id number] Date of Birth/Sex: 1971/08/26 (44 y.o. Male) Treating RN: Huel Coventry Primary Care Physician: Mayford Knife, ERIC Other Clinician: Referring Physician: Mayford Knife, ERIC Treating Physician/Extender: Rudene Re in Treatment: 3 Visit Information History Since Last Visit Added or deleted any medications: Yes Patient Arrived: Ambulatory Any new allergies or adverse reactions: No Arrival Time: 09:12 Had a fall or experienced change in No Accompanied By: wife activities of daily living that may affect Transfer Assistance: None risk of falls: Patient Identification Verified: Yes Signs or symptoms of abuse/neglect since last No Secondary Verification Process Yes visito Completed: Has Dressing in Place as Prescribed: Yes Patient Has Alerts: Yes Pain Present Now: No Patient Alerts: Patient on Blood Thinner Electronic Signature(s) Signed: 06/04/2015 10:20:46 AM By: Elliot Gurney, RN, BSN, Kim RN, BSN Entered By: Elliot Gurney, RN, BSN, Kim on 06/04/2015 09:17:18 [name], [name] ([id number]) -------------------------------------------------------------------------------- Encounter Discharge Information Details Patient Name: Oscar Mirza C. Date of Service: 06/04/2015 8:45 AM Medical Record Number: [id number] Patient Account Number: [id number] Date of Birth/Sex: 1971-09-21 (44 y.o. Male) Treating RN: Primary Care Physician: Mayford Knife, ERIC Other Clinician: Referring Physician: Mayford Knife, ERIC Treating Physician/Extender: Rudene Re in Treatment: 3 Encounter Discharge Information Items Schedule Follow-up Appointment: No Medication Reconciliation completed and No provided to Patient/Care Annalynne Ibanez: Patient Clinical Summary of Care: Declined Electronic Signature(s) Signed:  06/04/2015 9:50:52 AM By: Gwenlyn Perking Entered By: Gwenlyn Perking on 06/04/2015 09:50:52 Avey, [name] ([id number]) -------------------------------------------------------------------------------- Lower Extremity Assessment Details Patient Name: Oscar Mirza C. Date of Service: 06/04/2015 8:45 AM Medical Record Number: [id number] Patient Account Number: [id number] Date of Birth/Sex: Sep 29, 1971 (44 y.o. Male) Treating RN: Huel Coventry Primary Care Physician: Mayford Knife, ERIC Other Clinician: Referring Physician: Mayford Knife, ERIC Treating Physician/Extender: Rudene Re in Treatment: 3 Edema Assessment Assessed: [Left: No] [Right: No] E[Left: dema] [Right: :] Calf Left: Right: Point of Measurement: 33 cm From Medial Instep 45.4 cm 46.5 cm Ankle Left: Right: Point of Measurement: 11 cm From Medial Instep 31.5 cm 30.4 cm Vascular Assessment Pulses: Posterior Tibial Dorsalis Pedis Palpable: [Left:Yes] [Right:Yes] Extremity colors, hair growth, and conditions: Extremity Color: [Left:Dusky] [Right:Hyperpigmented] Hair Growth on Extremity: [Left:Yes] [Right:Yes] Temperature of Extremity: [Left:Warm] [Right:Warm] Capillary Refill: [Left:< 3 seconds] [Right:< 3 seconds] Toe Nail Assessment Left: Right: Thick: No Yes Discolored: No Yes Deformed: No No Improper Length and Hygiene: No No Electronic Signature(s) Signed: 06/04/2015 10:20:46 AM By: Elliot Gurney, RN, BSN, Kim RN, BSN Entered By: Elliot Gurney, RN, BSN, Kim on 06/04/2015 09:20:04 Tejada, [name] ([id number]) -------------------------------------------------------------------------------- Multi Wound Chart Details Patient Name: [name], [name] Date of Service: 06/04/2015 8:45 AM Medical Record Number: [id number] Patient Account Number: [id number] Date of Birth/Sex: [birth year] (44 y.o. Male) Treating RN: Huel Coventry Primary Care Physician: Mayford Knife, ERIC Other Clinician: Referring Physician: Mayford Knife, ERIC Treating Physician/Extender:  Rudene Re in Treatment: 3 Vital Signs Height(in): 72 Pulse(bpm): 81 Weight(lbs): 386 Blood Pressure 115/76 (mmHg): Body Mass Index(BMI): 52 Temperature(F): 98.2 Respiratory Rate 18 (breaths/min): Photos: [1:No Photos] [2:No Photos] [3:No Photos] Wound Location: [1:Right Lower Leg - Lateral Left Lower Leg - Lateral Left Lower Leg - Posterior] Wounding Event: [1:Gradually Appeared] [2:Gradually Appeared] [3:Gradually Appeared] Primary Etiology: [1:Venous Leg Ulcer] [2:Venous Leg Ulcer] [3:Venous Leg Ulcer] Comorbid History: [1:Sleep Apnea, Rheumatoid Sleep Apnea, Rheumatoid Sleep Apnea, Rheumatoid Arthritis] [2:Arthritis] [3:Arthritis] Date Acquired: [1:01/26/2015] [2:01/26/2015] [3:01/26/2015] Weeks of Treatment: [1:3] [2:3] [3:3] Wound Status: [1:Open] [2:Open] [3:Healed -  Epithelialized] Measurements L x W x D 1.8x2.2x0.1 [2:0.1x0.1x0.1] [3:0x0x0] (cm) Area (cm) : [1:3.11] [2:0.008] [3:0] Volume (cm) : [1:0.311] [2:0.001] [3:0] % Reduction in Area: [1:68.60%] [2:100.00%] [3:100.00%] % Reduction in Volume: 68.60% [2:100.00%] [3:100.00%] Classification: [1:Full Thickness Without Exposed Support Structures] [2:Partial Thickness] [3:Partial Thickness] Exudate Amount: [1:Large] [2:N/A] [3:N/A] Exudate Type: [1:Serous] [2:N/A] [3:N/A] Exudate Color: [1:amber] [2:N/A] [3:N/A] Wound Margin: [1:Indistinct, nonvisible] [2:N/A] [3:N/A] Granulation Amount: [1:Small (1-33%)] [2:Large (67-100%)] [3:N/A] Granulation Quality: [1:Pink, Hyper-granulation N/A] [3:N/A] Necrotic Amount: [1:Large (67-100%)] [2:None Present (0%)] [3:N/A] Exposed Structures: [1:Fascia: No Fat: No Tendon: No Muscle: No Joint: No] [2:N/A] [3:N/A] Bone: No Limited to Skin Breakdown Epithelialization: Small (1-33%) N/A N/A Periwound Skin Texture: No Abnormalities Noted No Abnormalities Noted No Abnormalities Noted Periwound Skin Maceration: Yes No Abnormalities Noted No Abnormalities Noted Moisture: Moist:  Yes Periwound Skin Color: No Abnormalities Noted No Abnormalities Noted No Abnormalities Noted Temperature: No Abnormality N/A N/A Tenderness on Yes No No Palpation: Wound Preparation: Ulcer Cleansing: N/A N/A Rinsed/Irrigated with Saline Topical Anesthetic Applied: Other: lidociane 4% Treatment Notes Electronic Signature(s) Signed: 06/04/2015 10:20:46 AM By: Elliot Gurney, RN, BSN, Kim RN, BSN Entered By: Elliot Gurney, RN, BSN, Kim on 06/04/2015 09:29:43 [name], [name] ([id number]) -------------------------------------------------------------------------------- Multi-Disciplinary Care Plan Details Patient Name: [name], [name] Date of Service: 06/04/2015 8:45 AM Medical Record Number: [id number] Patient Account Number: [id number] Date of Birth/Sex: April 17, 1971 (44 y.o. Male) Treating RN: Huel Coventry Primary Care Physician: Mayford Knife, ERIC Other Clinician: Referring Physician: Mayford Knife, ERIC Treating Physician/Extender: Rudene Re in Treatment: 3 Active Inactive Orientation to the Wound Care Program Nursing Diagnoses: Knowledge deficit related to the wound healing center program Goals: Patient/caregiver will verbalize understanding of the Wound Healing Center Program Date Initiated: 05/14/2015 Goal Status: Active Interventions: Provide education on orientation to the wound center Notes: Venous Leg Ulcer Nursing Diagnoses: Potential for venous Insuffiency (use before diagnosis confirmed) Goals: Non-invasive venous studies are completed as ordered Date Initiated: 05/14/2015 Goal Status: Active Interventions: Assess peripheral edema status every visit. Treatment Activities: Non-invasive vascular studies : 06/04/2015 Notes: Wound/Skin Impairment Nursing Diagnoses: Impaired tissue integrity [name], [name] ([id number]) Goals: Patient/caregiver will verbalize understanding of skin care regimen Date Initiated: 05/14/2015 Goal Status: Active Ulcer/skin breakdown will heal within  14 weeks Date Initiated: 05/14/2015 Goal Status: Active Interventions: Assess patient/caregiver ability to obtain necessary supplies Notes: Electronic Signature(s) Signed: 06/04/2015 10:20:46 AM By: Elliot Gurney, RN, BSN, Kim RN, BSN Entered By: Elliot Gurney, RN, BSN, Kim on 06/04/2015 09:29:31 [name], [name] ([id number]) -------------------------------------------------------------------------------- Pain Assessment Details Patient Name: Wauters, Lillian C. Date of Service: 06/04/2015 8:45 AM Medical Record Number: [id number] Patient Account Number: [id number] Date of Birth/Sex: 10/11/1971 (44 y.o. Male) Treating RN: Huel Coventry Primary Care Physician: Mayford Knife, ERIC Other Clinician: Referring Physician: Mayford Knife, ERIC Treating Physician/Extender: Rudene Re in Treatment: 3 Active Problems Location of Pain Severity and Description of Pain Patient Has Paino No Site Locations Pain Management and Medication Current Pain Management: Electronic Signature(s) Signed: 06/04/2015 10:20:46 AM By: Elliot Gurney, RN, BSN, Kim RN, BSN Entered By: Elliot Gurney, RN, BSN, Kim on 06/04/2015 09:17:23 Dannemiller, [name] ([id number]) -------------------------------------------------------------------------------- Wound Assessment Details Patient Name: Knecht, Tyrian C. Date of Service: 06/04/2015 8:45 AM Medical Record Number: [id number] Patient Account Number: [id number] Date of Birth/Sex: 1971-09-20 (44 y.o. Male) Treating RN: Huel Coventry Primary Care Physician: Mayford Knife, ERIC Other Clinician: Referring Physician: Mayford Knife, ERIC Treating Physician/Extender: Rudene Re in Treatment: 3 Wound Status Wound Number: 1 Primary Etiology: Venous Leg Ulcer Wound Location: Right Lower Leg - Lateral Wound Status: Open Wounding  Event: Gradually Appeared Comorbid Sleep Apnea, Rheumatoid History: Arthritis Date Acquired: 01/26/2015 Weeks Of Treatment: 3 Clustered Wound: No Wound Measurements Length: (cm) 1.8 Width: (cm)  2.2 Depth: (cm) 0.1 Area: (cm) 3.11 Volume: (cm) 0.311 % Reduction in Area: 68.6% % Reduction in Volume: 68.6% Epithelialization: Small (1-33%) Tunneling: No Undermining: No Wound Description Full Thickness Without Exposed Classification: Support Structures Wound Margin: Indistinct, nonvisible Exudate Large Amount: Exudate Type: Serous Exudate Color: amber Wound Bed Granulation Amount: Small (1-33%) Exposed Structure Granulation Quality: Pink, Hyper-granulation Fascia Exposed: No Necrotic Amount: Large (67-100%) Fat Layer Exposed: No Necrotic Quality: Adherent Slough Tendon Exposed: No Muscle Exposed: No Joint Exposed: No Bone Exposed: No Limited to Skin Breakdown Periwound Skin Texture Texture Color No Abnormalities Noted: No No Abnormalities Noted: No Moisture Temperature / Pain No Abnormalities Noted: No Temperature: No Abnormality [name], [name] ([id number]) Maceration: Yes Tenderness on Palpation: Yes Moist: Yes Wound Preparation Ulcer Cleansing: Rinsed/Irrigated with Saline Topical Anesthetic Applied: Other: lidociane 4%, Electronic Signature(s) Signed: 06/04/2015 10:20:46 AM By: Elliot Gurney, RN, BSN, Kim RN, BSN Entered By: Elliot Gurney, RN, BSN, Kim on 06/04/2015 09:27:40 [name], [name] ([id number]) -------------------------------------------------------------------------------- Wound Assessment Details Patient Name: [name], [name] Date of Service: 06/04/2015 8:45 AM Medical Record Number: [id number] Patient Account Number: [id number] Date of Birth/Sex: [birth year] (44 y.o. Male) Treating RN: Huel Coventry Primary Care Physician: Mayford Knife, ERIC Other Clinician: Referring Physician: Mayford Knife, ERIC Treating Physician/Extender: Rudene Re in Treatment: 3 Wound Status Wound Number: 2 Primary Etiology: Venous Leg Ulcer Wound Location: Left, Lateral Lower Leg Wound Status: Open Wounding Event: Gradually Appeared Comorbid Sleep Apnea,  Rheumatoid History: Arthritis Date Acquired: 01/26/2015 Weeks Of Treatment: 3 Clustered Wound: No Wound Measurements Length: (cm) 0 % Reduction in Width: (cm) 0 % Reduction in Depth: (cm) 0 Area: (cm) 0 Volume: (cm) 0 Area: 100% Volume: 100% Wound Description Classification: Partial Thickness Wound Bed Granulation Amount: Large (67-100%) Necrotic Amount: None Present (0%) Periwound Skin Texture Texture Color No Abnormalities Noted: No No Abnormalities Noted: No Moisture No Abnormalities Noted: No Electronic Signature(s) Signed: 06/04/2015 10:20:46 AM By: Elliot Gurney, RN, BSN, Kim RN, BSN Entered By: Elliot Gurney, RN, BSN, Kim on 06/04/2015 09:47:23 [name], [name] ([id number]) -------------------------------------------------------------------------------- Wound Assessment Details Patient Name: [name], Davine C. Date of Service: 06/04/2015 8:45 AM Medical Record Number: [id number] Patient Account Number: [id number] Date of Birth/Sex: 08/20/1971 (44 y.o. Male) Treating RN: Huel Coventry Primary Care Physician: Mayford Knife, ERIC Other Clinician: Referring Physician: Mayford Knife, ERIC Treating Physician/Extender: Rudene Re in Treatment: 3 Wound Status Wound Number: 3 Primary Etiology: Venous Leg Ulcer Wound Location: Left Lower Leg - Posterior Wound Status: Healed - Epithelialized Wounding Event: Gradually Appeared Comorbid Sleep Apnea, Rheumatoid History: Arthritis Date Acquired: 01/26/2015 Weeks Of Treatment: 3 Clustered Wound: No Wound Measurements Length: (cm) 0 % Reduction Width: (cm) 0 % Reduction Depth: (cm) 0 Area: (cm) 0 Volume: (cm) 0 in Area: 100% in Volume: 100% Wound Description Classification: Partial Thickness Periwound Skin Texture Texture Color No Abnormalities Noted: No No Abnormalities Noted: No Moisture No Abnormalities Noted: No Electronic Signature(s) Signed: 06/04/2015 10:20:46 AM By: Elliot Gurney, RN, BSN, Kim RN, BSN Entered By: Elliot Gurney, RN, BSN, Kim  on 06/04/2015 57:01:77 Vawter, [name] ([id number]) -------------------------------------------------------------------------------- Vitals Details Patient Name: Burbano, Dejour C. Date of Service: 06/04/2015 8:45 AM Medical Record Number: [id number] Patient Account Number: [id number] Date of Birth/Sex: [birth year] (44 y.o. Male) Treating RN: Huel Coventry Primary Care Physician: Mayford Knife, ERIC Other Clinician: Referring Physician: Mayford Knife, ERIC Treating Physician/Extender: Rudene Re in Treatment: 3 Vital Signs  Time Taken: 09:17 Temperature (F): 98.2 Height (in): 72 Pulse (bpm): 81 Weight (lbs): 386 Respiratory Rate (breaths/min): 18 Body Mass Index (BMI): 52.3 Blood Pressure (mmHg): 115/76 Reference Range: 80 - 120 mg / dl Electronic Signature(s) Signed: 06/04/2015 10:20:46 AM By: Elliot Gurney, RN, BSN, Kim RN, BSN Entered By: Elliot Gurney, RN, BSN, Kim on 06/04/2015 09:17:48

## 2015-06-04 NOTE — Progress Notes (Addendum)
Oscar, [name] ([id number]) Visit Report for 06/04/2015 Chief Complaint Document Details Patient Name: [name], [name]. Date of Service: 06/04/2015 8:45 AM Medical Record Number: [id number] Patient Account Number: [id number] Date of Birth/Sex: 09-Nov-1970 (44 y.o. Male) Treating RN: Primary Care Physician: Mayford Knife, ERIC Other Clinician: Referring Physician: Mayford Knife, ERIC Treating Physician/Extender: Rudene Re in Treatment: 3 Information Obtained from: Patient Chief Complaint Patient presents for treatment of an open ulcer due to venous insufficiency. 44 year old gentleman who has bilateral lower leg swelling and ulceration for the last 3 months. Electronic Signature(s) Signed: 06/04/2015 9:38:33 AM By: Evlyn Kanner MD, FACS Entered By: Evlyn Kanner on 06/04/2015 09:38:33 Bitter, [name] ([id number]) -------------------------------------------------------------------------------- HPI Details Patient Name: Oscar Mirza C. Date of Service: 06/04/2015 8:45 AM Medical Record Number: [id number] Patient Account Number: [id number] Date of Birth/Sex: Feb 27, 1971 (44 y.o. Male) Treating RN: Primary Care Physician: Mayford Knife, ERIC Other Clinician: Referring Physician: Mayford Knife, ERIC Treating Physician/Extender: Rudene Re in Treatment: 3 History of Present Illness Location: bilateral lower extremity swelling and ulceration Quality: Patient reports experiencing a dull pain to affected area(s). Severity: Patient states wound are getting worse. Duration: Patient has had the wound for > 3 months prior to seeking treatment at the wound center Timing: Pain in wound is Intermittent (comes and goes Context: The wound appeared gradually over time Modifying Factors: Other treatment(s) tried include: has seen his PCP and has been on Keflex. Associated Signs and Symptoms: Patient reports having difficulty standing for long periods. HPI Description: This 44 year old gentleman has been  referred from his PCPs office where he was seen by Esperanza Sheets, PA. The patient is known to be seen most recently for morbidly obese, sleep apnea, chronic lower extremity edema, osteoarthritis, pain in the legs. His past medical history is significant for chronic venous insufficiency, pain in the back, morbid obesity, cardiomegaly, osteoarthritis of the hip, sleep apnea, kidney stones, hematuria, ulcers both lower extremities. He had a left hip arthroplasty in April of this year. He has never had venous duplex studies done on his lower extremities though he has been to the wound center several years ago. He has also not been wearing his compression stockings. 05/21/2015. He has been compliant with elevation of the limb and wearing his sleep apnea machine so that he can breathe better and try and get to sleep in a bed. His vascular studies as scheduled for early August. 05/28/2015 -- he has his vascular appointment tomorrow afternoon and other than that has been doing very well. 06/04/2015 -- he had his vascular studies done last week and he goes back for them to be red and to get the opinion of the vascular surgeon. His insurance has not given him the Jobst stocking yet and he is otherwise doing fine. Electronic Signature(s) Signed: 06/04/2015 9:39:08 AM By: Evlyn Kanner MD, FACS Entered By: Evlyn Kanner on 06/04/2015 09:39:07 Landeck, [name] ([id number]) -------------------------------------------------------------------------------- Physical Exam Details Patient Name: [name], [name] C. Date of Service: 06/04/2015 8:45 AM Medical Record Number: [id number] Patient Account Number: [id number] Date of Birth/Sex: [birth year] (44 y.o. Male) Treating RN: Primary Care Physician: Mayford Knife, ERIC Other Clinician: Referring Physician: Mayford Knife, ERIC Treating Physician/Extender: Rudene Re in Treatment: 3 Constitutional . Pulse regular. Respirations normal and unlabored. Afebrile.  . Eyes Nonicteric. Reactive to light. Ears, Nose, Mouth, and Throat Lips, teeth, and gums WNL.[name] Moist mucosa without lesions . Neck supple and nontender. No palpable supraclavicular or cervical adenopathy. Normal sized without goiter. Respiratory WNL. No retractions.. Cardiovascular Pedal Pulses WNL. No clubbing, cyanosis  or edema. Lymphatic No adneopathy. No adenopathy. No adenopathy. Musculoskeletal Adexa without tenderness or enlargement.. Digits and nails w/o clubbing, cyanosis, infection, petechiae, ischemia, or inflammatory conditions.. Integumentary (Hair, Skin) No suspicious lesions. No crepitus or fluctuance. No peri-wound warmth or erythema. No masses.[name] Psychiatric Judgement and insight Intact.. No evidence of depression, anxiety, or agitation.. Notes The left leg has completely healed and the right leg is looking clean which is going to be ready for a skin substitute and we will get him some Apligraf. Electronic Signature(s) Signed: 06/04/2015 9:39:50 AM By: Evlyn Kanner MD, FACS Entered By: Evlyn Kanner on 06/04/2015 09:39:49 [name] ([id number]) -------------------------------------------------------------------------------- Physician Orders Details Patient Name: Oscar Mirza C. Date of Service: 06/04/2015 8:45 AM Medical Record Number: [id number] Patient Account Number: [id number] Date of Birth/Sex: [birth year] (44 y.o. Male) Treating RN: Huel Coventry Primary Care Physician: Mayford Knife, ERIC Other Clinician: Referring Physician: Mayford Knife, ERIC Treating Physician/Extender: Rudene Re in Treatment: 3 Verbal / Phone Orders: Yes Clinician: Huel Coventry Read Back and Verified: Yes Diagnosis Coding ICD-10 Coding Code Description I87.313 Chronic venous hypertension (idiopathic) with ulcer of bilateral lower extremity L97.212 Non-pressure chronic ulcer of right calf with fat layer exposed L97.222 Non-pressure chronic ulcer of left calf with fat layer  exposed E66.01 Morbid (severe) obesity due to excess calories I89.0 Lymphedema, not elsewhere classified Wound Cleansing Wound #1 Right,Lateral Lower Leg o Clean wound with Normal Saline. Anesthetic Wound #1 Right,Lateral Lower Leg o Topical Lidocaine 4% cream applied to wound bed prior to debridement Skin Barriers/Peri-Wound Care Wound #1 Right,Lateral Lower Leg o Barrier cream Primary Wound Dressing Wound #1 Right,Lateral Lower Leg o Aquacel Ag Secondary Dressing Wound #1 Right,Lateral Lower Leg o ABD pad Dressing Change Frequency Wound #1 Right,Lateral Lower Leg o Change dressing every week Follow-up Appointments Wound #1 Right,Lateral Lower Leg Billing, Jarom C. ([id number]) o Return Appointment in 1 week. o Nurse Visit as needed - wrap after vascular apt if needed Edema Control Wound #1 Right,Lateral Lower Leg o Unna Boot to Right Lower Extremity o Elevate legs to the level of the heart and pump ankles as often as possible o Tubigrip - Doube layer on left leg. Patient to buy compression stockings. Additional Orders / Instructions Wound #1 Right,Lateral Lower Leg o Increase protein intake. o OK to return to work o Activity as tolerated o Other: - C-pap to be used every Counselling psychologist) Signed: 06/04/2015 10:20:46 AM By: Elliot Gurney, RN, BSN, Kim RN, BSN Signed: 06/04/2015 1:11:05 PM By: Evlyn Kanner MD, FACS Entered By: Elliot Gurney, RN, BSN, Kim on 06/04/2015 09:54:12 Arpin, [name] ([id number]) -------------------------------------------------------------------------------- Problem List Details Patient Name: Pasko, Foday C. Date of Service: 06/04/2015 8:45 AM Medical Record Number: [id number] Patient Account Number: [id number] Date of Birth/Sex: Aug 01, 1971 (44 y.o. Male) Treating RN: Primary Care Physician: Mayford Knife, ERIC Other Clinician: Referring Physician: Mayford Knife, ERIC Treating Physician/Extender: Rudene Re in  Treatment: 3 Active Problems ICD-10 Encounter Code Description Active Date Diagnosis I87.313 Chronic venous hypertension (idiopathic) with ulcer of 05/14/2015 Yes bilateral lower extremity L97.212 Non-pressure chronic ulcer of right calf with fat layer 05/14/2015 Yes exposed L97.222 Non-pressure chronic ulcer of left calf with fat layer 05/14/2015 Yes exposed E66.01 Morbid (severe) obesity due to excess calories 05/14/2015 Yes I89.0 Lymphedema, not elsewhere classified 05/14/2015 Yes Inactive Problems Resolved Problems Electronic Signature(s) Signed: 06/04/2015 9:38:21 AM By: Evlyn Kanner MD, FACS Entered By: Evlyn Kanner on 06/04/2015 09:38:21 Kopplin, Jabier Salena Saner ([id number]) -------------------------------------------------------------------------------- Progress Note Details Patient Name: Garfield, Vuk C. Date of Service: 06/04/2015 8:45  AM Medical Record Number: [id number] Patient Account Number: [id number] Date of Birth/Sex: [birth year] (44 y.o. Male) Treating RN: Primary Care Physician: Mayford Knife, ERIC Other Clinician: Referring Physician: Mayford Knife, ERIC Treating Physician/Extender: Rudene Re in Treatment: 3 Subjective Chief Complaint Information obtained from Patient Patient presents for treatment of an open ulcer due to venous insufficiency. 44 year old gentleman who has bilateral lower leg swelling and ulceration for the last 3 months. History of Present Illness (HPI) The following HPI elements were documented for the patient's wound: Location: bilateral lower extremity swelling and ulceration Quality: Patient reports experiencing a dull pain to affected area(s). Severity: Patient states wound are getting worse. Duration: Patient has had the wound for > 3 months prior to seeking treatment at the wound center Timing: Pain in wound is Intermittent (comes and goes Context: The wound appeared gradually over time Modifying Factors: Other treatment(s) tried include: has  seen his PCP and has been on Keflex. Associated Signs and Symptoms: Patient reports having difficulty standing for long periods. This 44 year old gentleman has been referred from his PCPs office where he was seen by Esperanza Sheets, PA. The patient is known to be seen most recently for morbidly obese, sleep apnea, chronic lower extremity edema, osteoarthritis, pain in the legs. His past medical history is significant for chronic venous insufficiency, pain in the back, morbid obesity, cardiomegaly, osteoarthritis of the hip, sleep apnea, kidney stones, hematuria, ulcers both lower extremities. He had a left hip arthroplasty in April of this year. He has never had venous duplex studies done on his lower extremities though he has been to the wound center several years ago. He has also not been wearing his compression stockings. 05/21/2015. He has been compliant with elevation of the limb and wearing his sleep apnea machine so that he can breathe better and try and get to sleep in a bed. His vascular studies as scheduled for early August. 05/28/2015 -- he has his vascular appointment tomorrow afternoon and other than that has been doing very well. 06/04/2015 -- he had his vascular studies done last week and he goes back for them to be red and to get the opinion of the vascular surgeon. His insurance has not given him the Jobst stocking yet and he is otherwise doing fine. Milazzo, Richard C. ([id number]) Objective Constitutional Pulse regular. Respirations normal and unlabored. Afebrile. Vitals Time Taken: 9:17 AM, Height: 72 in, Weight: 386 lbs, BMI: 52.3, Temperature: 98.2 F, Pulse: 81 bpm, Respiratory Rate: 18 breaths/min, Blood Pressure: 115/76 mmHg. Eyes Nonicteric. Reactive to light. Ears, Nose, Mouth, and Throat Lips, teeth, and gums WNL.[name] Moist mucosa without lesions . Neck supple and nontender. No palpable supraclavicular or cervical adenopathy. Normal sized without  goiter. Respiratory WNL. No retractions.. Cardiovascular Pedal Pulses WNL. No clubbing, cyanosis or edema. Lymphatic No adneopathy. No adenopathy. No adenopathy. Musculoskeletal Adexa without tenderness or enlargement.. Digits and nails w/o clubbing, cyanosis, infection, petechiae, ischemia, or inflammatory conditions.[name] Psychiatric Judgement and insight Intact.. No evidence of depression, anxiety, or agitation.. General Notes: The left leg has completely healed and the right leg is looking clean which is going to be ready for a skin substitute and we will get him some Apligraf. Integumentary (Hair, Skin) No suspicious lesions. No crepitus or fluctuance. No peri-wound warmth or erythema. No masses.. Wound #1 status is Open. Original cause of wound was Gradually Appeared. The wound is located on the Right,Lateral Lower Leg. The wound measures 1.8cm length x 2.2cm width x 0.1cm depth; 3.11cm^2 area and 0.311cm^3 volume. The wound  is limited to skin breakdown. There is no tunneling or undermining noted. There is a large amount of serous drainage noted. The wound margin is indistinct and nonvisible. There is small (1-33%) pink granulation within the wound bed. There is a large (67-100%) amount of necrotic tissue within the wound bed including Adherent Slough. The periwound skin appearance exhibited: Lampton, Jourdan C. ([id number]) Maceration, Moist. Periwound temperature was noted as No Abnormality. The periwound has tenderness on palpation. Wound #2 status is Open. Original cause of wound was Gradually Appeared. The wound is located on the Left,Lateral Lower Leg. The wound measures 0cm length x 0cm width x 0cm depth; 0cm^2 area and 0cm^3 volume. There is large (67-100%) granulation within the wound bed. There is no necrotic tissue within the wound bed. Wound #3 status is Healed - Epithelialized. Original cause of wound was Gradually Appeared. The wound is located on the Left,Posterior Lower  Leg. The wound measures 0cm length x 0cm width x 0cm depth; 0cm^2 area and 0cm^3 volume. Assessment Active Problems ICD-10 I87.313 - Chronic venous hypertension (idiopathic) with ulcer of bilateral lower extremity L97.212 - Non-pressure chronic ulcer of right calf with fat layer exposed L97.222 - Non-pressure chronic ulcer of left calf with fat layer exposed E66.01 - Morbid (severe) obesity due to excess calories I89.0 - Lymphedema, not elsewhere classified We will await his vascular surgeons opinion regarding his lower extremity venous status. he will benefit from Apligraf and we will put it through his insurance company. We have also given him advice regarding using Jobst stocking or a off brand variety for compression of his left lower extremity. He will come back and see as next week. Plan Wound Cleansing: Wound #1 Right,Lateral Lower Leg: Clean wound with Normal Saline. Anesthetic: Wound #1 Right,Lateral Lower Leg: Topical Lidocaine 4% cream applied to wound bed prior to debridement Skin Barriers/Peri-Wound Care: Wound #1 Right,Lateral Lower Leg: Barrier cream Primary Wound Dressing: Wound #1 Right,Lateral Lower Leg: [place], Jaquan C. ([id number]) Aquacel Ag Secondary Dressing: Wound #1 Right,Lateral Lower Leg: ABD pad Dressing Change Frequency: Wound #1 Right,Lateral Lower Leg: Change dressing every week Follow-up Appointments: Wound #1 Right,Lateral Lower Leg: Return Appointment in 1 week. Nurse Visit as needed - wrap after vascular apt if needed Edema Control: Wound #1 Right,Lateral Lower Leg: Unna Boot to Right Lower Extremity Elevate legs to the level of the heart and pump ankles as often as possible Tubigrip - Doube layer on left leg. Patient to buy compression stockings. Additional Orders / Instructions: Wound #1 Right,Lateral Lower Leg: Increase protein intake. OK to return to work Activity as tolerated Other: - C-pap to be used every night We will await  his vascular surgeons opinion regarding his lower extremity venous status. he will benefit from Apligraf and we will put it through his insurance company. We have also given him advice regarding using Jobst stocking or a off brand variety for compression of his left lower extremity. He will come back and see as next week. Electronic Signature(s) Signed: 06/05/2015 4:11:01 PM By: Evlyn Kanner MD, FACS Previous Signature: 06/04/2015 9:41:10 AM Version By: Evlyn Kanner MD, FACS Entered By: Evlyn Kanner on 06/05/2015 16:11:01 Dougher, [name] ([id number]) -------------------------------------------------------------------------------- SuperBill Details Patient Name: Rubenstein, Gari C. Date of Service: 06/04/2015 Medical Record Number: [id number] Patient Account Number: [id number] Date of Birth/Sex: 05-09-1971 (44 y.o. Male) Treating RN: Primary Care Physician: Mayford Knife, ERIC Other Clinician: Referring Physician: Mayford Knife, ERIC Treating Physician/Extender: Rudene Re in Treatment: 3 Diagnosis Coding ICD-10 Codes Code Description I87.313 Chronic  venous hypertension (idiopathic) with ulcer of bilateral lower extremity L97.212 Non-pressure chronic ulcer of right calf with fat layer exposed L97.222 Non-pressure chronic ulcer of left calf with fat layer exposed E66.01 Morbid (severe) obesity due to excess calories I89.0 Lymphedema, not elsewhere classified Facility Procedures CPT4 Code: [id number] Description: (Facility Use Only) [phone] - APPLY Roland Rack BOOT RT Modifier: Quantity: 1 Physician Procedures CPT4: Description Modifier Quantity Code [id number] - WC PHYS LEVEL 3 - EST PT 1 ICD-10 Description Diagnosis I87.313 Chronic venous hypertension (idiopathic) with ulcer of bilateral lower extremity L97.212 Non-pressure chronic ulcer of right calf  with fat layer exposed L97.222 Non-pressure chronic ulcer of left calf with fat layer exposed I89.0 Lymphedema, not elsewhere  classified Electronic Signature(s) Signed: 06/04/2015 5:35:40 PM By: Elliot Gurney, RN, BSN, Kim RN, BSN Signed: 06/05/2015 12:16:19 PM By: Evlyn Kanner MD, FACS Previous Signature: 06/04/2015 10:31:35 AM Version By: Evlyn Kanner MD, FACS Entered By: Elliot Gurney RN, BSN, Kim on 06/04/2015 17:34:50

## 2015-06-11 ENCOUNTER — Encounter: Payer: No Typology Code available for payment source | Admitting: Surgery

## 2015-06-11 DIAGNOSIS — L97212 Non-pressure chronic ulcer of right calf with fat layer exposed: Secondary | ICD-10-CM | POA: Diagnosis not present

## 2015-06-11 NOTE — Progress Notes (Addendum)
KHADEEM, ROCKETT ([id number]) Visit Report for 06/11/2015 Chief Complaint Document Details Patient Name: [name], [name]. Date of Service: 06/11/2015 8:45 AM Medical Record Number: [id number] Patient Account Number: [id number] Date of Birth/Sex: 04-Jan-1971 (44 y.o. Male) Treating RN: Curtis Sites Primary Care Physician: Mayford Knife, ERIC Other Clinician: Referring Physician: Mayford Knife, ERIC Treating Physician/Extender: Rudene Re in Treatment: 4 Information Obtained from: Patient Chief Complaint Patient presents for treatment of an open ulcer due to venous insufficiency. 44 year old gentleman who has bilateral lower leg swelling and ulceration for the last 3 months. Electronic Signature(s) Signed: 06/11/2015 9:51:58 AM By: Evlyn Kanner MD, FACS Entered By: Evlyn Kanner on 06/11/2015 09:51:58 [place], [name] ([id number]) -------------------------------------------------------------------------------- Cellular or Tissue Based Product Details Patient Name: Oscar Mirza C. Date of Service: 06/11/2015 8:45 AM Medical Record Number: [id number] Patient Account Number: [id number] Date of Birth/Sex: 18-Mar-1971 (44 y.o. Male) Treating RN: Curtis Sites Primary Care Physician: Mayford Knife, ERIC Other Clinician: Referring Physician: Mayford Knife, ERIC Treating Physician/Extender: Rudene Re in Treatment: 4 Cellular or Tissue Based Wound #1 Right,Lateral Lower Leg Product Type Applied to: Performed By: Physician Tristan Schroeder., MD Cellular or Tissue Based Apligraf Product Type: Time-Out Taken: Yes Location: trunk / arms / legs Wound Size (sq cm): 5 Product Size (sq cm): 44 Waste Size (sq cm): 39 Waste Reason: wound size Amount of Product Applied (sq cm): 5 Lot #: [id number] Order #: [id number] Expiration Date: 06/16/2015 Fenestrated: Yes Instrument: Blade Reconstituted: Yes Solution Type: nacl Solution Amount: 62ml Lot #: [id number] Solution Expiration  01/25/2017 Date: Secured: Yes Secured With: Steri-Strips Dressing Applied: Yes Primary Dressing: mepitel one Procedural Pain: 0 Post Procedural Pain: 0 Response to Treatment: Procedure was tolerated well Electronic Signature(s) Signed: 06/11/2015 10:28:37 AM By: Evlyn Kanner MD, FACS Entered By: Evlyn Kanner on 06/11/2015 10:28:37 Haigh, [name] ([id number]) -------------------------------------------------------------------------------- HPI Details Patient Name: Oscar Mirza C. Date of Service: 06/11/2015 8:45 AM Medical Record Number: [id number] Patient Account Number: [id number] Date of Birth/Sex: 1971-09-12 (44 y.o. Male) Treating RN: Curtis Sites Primary Care Physician: Mayford Knife, ERIC Other Clinician: Referring Physician: Mayford Knife, ERIC Treating Physician/Extender: Rudene Re in Treatment: 4 History of Present Illness Location: bilateral lower extremity swelling and ulceration Quality: Patient reports experiencing a dull pain to affected area(s). Severity: Patient states wound are getting worse. Duration: Patient has had the wound for > 3 months prior to seeking treatment at the wound center Timing: Pain in wound is Intermittent (comes and goes Context: The wound appeared gradually over time Modifying Factors: Other treatment(s) tried include: has seen his PCP and has been on Keflex. Associated Signs and Symptoms: Patient reports having difficulty standing for long periods. HPI Description: This 44 year old gentleman has been referred from his PCPs office where he was seen by Esperanza Sheets, PA. The patient is known to be seen most recently for morbidly obese, sleep apnea, chronic lower extremity edema, osteoarthritis, pain in the legs. His past medical history is significant for chronic venous insufficiency, pain in the back, morbid obesity, cardiomegaly, osteoarthritis of the hip, sleep apnea, kidney stones, hematuria, ulcers both lower extremities. He had a left  hip arthroplasty in April of this year. He has never had venous duplex studies done on his lower extremities though he has been to the wound center several years ago. He has also not been wearing his compression stockings. 05/21/2015. He has been compliant with elevation of the limb and wearing his sleep apnea machine so that he can breathe better and try and get to sleep in a bed.  His vascular studies as scheduled for early August. 05/28/2015 -- he has his vascular appointment tomorrow afternoon and other than that has been doing very well. 06/04/2015 -- he had his vascular studies done last week and he goes back for them to be red and to get the opinion of the vascular surgeon. His insurance has not given him the Jobst stocking yet and he is otherwise doing fine. 06/11/2015 -- he did visit the vascular surgeons and though we don't have the official report the patient says that his study was complete and no surgical intervention was recommended the present time. He had compression stockings recommended for his venous hypertension and at some stage if he continued to have problems they would recommended lymphedema pumps. We will await official report regarding his vascular workup. In the meanwhile his wound is doing well and he has insurance approval for Apligraf and we will proceed with the first application of Apligraf today. Electronic Signature(s) Signed: 06/11/2015 9:53:07 AM By: Evlyn Kanner MD, FACS Entered By: Evlyn Kanner on 06/11/2015 09:53:07 [name], [name] ([id number]) [name], [name] ([id number]) -------------------------------------------------------------------------------- Physical Exam Details Patient Name: [name], [name] Date of Service: 06/11/2015 8:45 AM Medical Record Number: [id number] Patient Account Number: [id number] Date of Birth/Sex: [birth year] (44 y.o. Male) Treating RN: Curtis Sites Primary Care Physician: Mayford Knife, ERIC Other Clinician: Referring  Physician: Mayford Knife, ERIC Treating Physician/Extender: Rudene Re in Treatment: 4 Constitutional . Pulse regular. Respirations normal and unlabored. Afebrile. . Eyes Nonicteric. Reactive to light. Ears, Nose, Mouth, and Throat Lips, teeth, and gums WNL.Marland Kitchen Moist mucosa without lesions . Neck supple and nontender. No palpable supraclavicular or cervical adenopathy. Normal sized without goiter. Respiratory WNL. No retractions.. Breath sounds WNL, No rubs, rales, rhonchi, or wheeze.. Cardiovascular Heart rhythm and rate regular, no murmur or gallop.. Pedal Pulses WNL. No clubbing, cyanosis or edema. Lymphatic No adneopathy. No adenopathy. No adenopathy. Musculoskeletal Adexa without tenderness or enlargement.. Digits and nails w/o clubbing, cyanosis, infection, petechiae, ischemia, or inflammatory conditions.. Integumentary (Hair, Skin) No suspicious lesions. No crepitus or fluctuance. No peri-wound warmth or erythema. No masses.Marland Kitchen Psychiatric Judgement and insight Intact.. No evidence of depression, anxiety, or agitation.. Notes His right leg wound has healthy granulation tissue and after washing it out well with saline he is ready for his first application of Apligraf. Electronic Signature(s) Signed: 06/11/2015 9:53:54 AM By: Evlyn Kanner MD, FACS Entered By: Evlyn Kanner on 06/11/2015 09:53:53 [name], [name] ([id number]) -------------------------------------------------------------------------------- Physician Orders Details Patient Name: Oscar Mirza C. Date of Service: 06/11/2015 8:45 AM Medical Record Number: [id number] Patient Account Number: [id number] Date of Birth/Sex: 1971-01-24 (44 y.o. Male) Treating RN: Huel Coventry Primary Care Physician: Mayford Knife, ERIC Other Clinician: Referring Physician: Mayford Knife, ERIC Treating Physician/Extender: Rudene Re in Treatment: 4 Verbal / Phone Orders: No Diagnosis Coding Wound Cleansing Wound #1 Right,Lateral Lower  Leg o Cleanse wound with mild soap and water Anesthetic Wound #1 Right,Lateral Lower Leg o Topical Lidocaine 4% cream applied to wound bed prior to debridement Primary Wound Dressing o Drawtex Dressing Change Frequency Wound #1 Right,Lateral Lower Leg o Change dressing every week Follow-up Appointments Wound #1 Right,Lateral Lower Leg o Return Appointment in 1 week. Edema Control o Unna Boot to Right Lower Extremity Advanced Therapies Wound #1 Right,Lateral Lower Leg o Apligraf application in clinic; including contact layer, fixation with steri strips, dry gauze and cover dressing. Electronic Signature(s) Signed: 06/11/2015 12:25:57 PM By: Evlyn Kanner MD, FACS Signed: 06/11/2015 6:31:19 PM By: Elliot Gurney RN, BSN, Kim RN, BSN Entered  By: Elliot Gurney, RN, BSN, Kim on 06/11/2015 09:55:25 [name], [name] ([id number]) -------------------------------------------------------------------------------- Problem List Details Patient Name: [name], Oscar Huh C. Date of Service: 06/11/2015 8:45 AM Medical Record Number: [id number] Patient Account Number: [id number] Date of Birth/Sex: Nov 15, 1970 (44 y.o. Male) Treating RN: Curtis Sites Primary Care Physician: Mayford Knife, ERIC Other Clinician: Referring Physician: Mayford Knife, ERIC Treating Physician/Extender: Rudene Re in Treatment: 4 Active Problems ICD-10 Encounter Code Description Active Date Diagnosis I87.313 Chronic venous hypertension (idiopathic) with ulcer of 05/14/2015 Yes bilateral lower extremity L97.212 Non-pressure chronic ulcer of right calf with fat layer 05/14/2015 Yes exposed L97.222 Non-pressure chronic ulcer of left calf with fat layer 05/14/2015 Yes exposed E66.01 Morbid (severe) obesity due to excess calories 05/14/2015 Yes I89.0 Lymphedema, not elsewhere classified 05/14/2015 Yes Inactive Problems Resolved Problems Electronic Signature(s) Signed: 06/11/2015 9:51:52 AM By: Evlyn Kanner MD, FACS Previous  Signature: 06/11/2015 9:46:16 AM Version By: Evlyn Kanner MD, FACS Previous Signature: 06/11/2015 9:45:49 AM Version By: Evlyn Kanner MD, FACS Entered By: Evlyn Kanner on 06/11/2015 09:51:52 [name], [name] ([id number]) -------------------------------------------------------------------------------- Progress Note Details Patient Name: [name], [name] Date of Service: 06/11/2015 8:45 AM Medical Record Number: [id number] Patient Account Number: [id number] Date of Birth/Sex: Mar 06, 1971 (44 y.o. Male) Treating RN: Curtis Sites Primary Care Physician: Mayford Knife, ERIC Other Clinician: Referring Physician: Mayford Knife, ERIC Treating Physician/Extender: Rudene Re in Treatment: 4 Subjective Chief Complaint Information obtained from Patient Patient presents for treatment of an open ulcer due to venous insufficiency. 44 year old gentleman who has bilateral lower leg swelling and ulceration for the last 3 months. History of Present Illness (HPI) The following HPI elements were documented for the patient's wound: Location: bilateral lower extremity swelling and ulceration Quality: Patient reports experiencing a dull pain to affected area(s). Severity: Patient states wound are getting worse. Duration: Patient has had the wound for > 3 months prior to seeking treatment at the wound center Timing: Pain in wound is Intermittent (comes and goes Context: The wound appeared gradually over time Modifying Factors: Other treatment(s) tried include: has seen his PCP and has been on Keflex. Associated Signs and Symptoms: Patient reports having difficulty standing for long periods. This 44 year old gentleman has been referred from his PCPs office where he was seen by Esperanza Sheets, PA. The patient is known to be seen most recently for morbidly obese, sleep apnea, chronic lower extremity edema, osteoarthritis, pain in the legs. His past medical history is significant for chronic venous insufficiency,  pain in the back, morbid obesity, cardiomegaly, osteoarthritis of the hip, sleep apnea, kidney stones, hematuria, ulcers both lower extremities. He had a left hip arthroplasty in April of this year. He has never had venous duplex studies done on his lower extremities though he has been to the wound center several years ago. He has also not been wearing his compression stockings. 05/21/2015. He has been compliant with elevation of the limb and wearing his sleep apnea machine so that he can breathe better and try and get to sleep in a bed. His vascular studies as scheduled for early August. 05/28/2015 -- he has his vascular appointment tomorrow afternoon and other than that has been doing very well. 06/04/2015 -- he had his vascular studies done last week and he goes back for them to be red and to get the opinion of the vascular surgeon. His insurance has not given him the Jobst stocking yet and he is otherwise doing fine. 06/11/2015 -- he did visit the vascular surgeons and though we don't have the official report the patient  says that his study was complete and no surgical intervention was recommended the present time. He had compression stockings recommended for his venous hypertension and at some stage if he continued to have problems they would recommended lymphedema pumps. We will await official report regarding his vascular workup. [name], [name] ([id number]) In the meanwhile his wound is doing well and he has insurance approval for Apligraf and we will proceed with the first application of Apligraf today. Objective Constitutional Pulse regular. Respirations normal and unlabored. Afebrile. Vitals Time Taken: 9:20 AM, Height: 72 in, Weight: 386 lbs, BMI: 52.3, Temperature: 98.1 F, Pulse: 90 bpm, Respiratory Rate: 18 breaths/min, Blood Pressure: 159/81 mmHg. Eyes Nonicteric. Reactive to light. Ears, Nose, Mouth, and Throat Lips, teeth, and gums WNL.Marland Kitchen Moist mucosa without lesions  . Neck supple and nontender. No palpable supraclavicular or cervical adenopathy. Normal sized without goiter. Respiratory WNL. No retractions.. Breath sounds WNL, No rubs, rales, rhonchi, or wheeze.. Cardiovascular Heart rhythm and rate regular, no murmur or gallop.. Pedal Pulses WNL. No clubbing, cyanosis or edema. Lymphatic No adneopathy. No adenopathy. No adenopathy. Musculoskeletal Adexa without tenderness or enlargement.. Digits and nails w/o clubbing, cyanosis, infection, petechiae, ischemia, or inflammatory conditions.Marland Kitchen Psychiatric Judgement and insight Intact.. No evidence of depression, anxiety, or agitation.. General Notes: His right leg wound has healthy granulation tissue and after washing it out well with saline he is ready for his first application of Apligraf. Integumentary (Hair, Skin) No suspicious lesions. No crepitus or fluctuance. No peri-wound warmth or erythema. No masses.Marland Kitchen [name], [name] C. ([id number]) Wound #1 status is Open. Original cause of wound was Gradually Appeared. The wound is located on the Right,Lateral Lower Leg. The wound measures 2cm length x 2.5cm width x 0.1cm depth; 3.927cm^2 area and 0.393cm^3 volume. The wound is limited to skin breakdown. There is a large amount of serous drainage noted. The wound margin is indistinct and nonvisible. There is small (1-33%) pink granulation within the wound bed. There is a large (67-100%) amount of necrotic tissue within the wound bed including Adherent Slough. The periwound skin appearance exhibited: Maceration, Moist. Periwound temperature was noted as No Abnormality. The periwound has tenderness on palpation. Assessment Active Problems ICD-10 I87.313 - Chronic venous hypertension (idiopathic) with ulcer of bilateral lower extremity L97.212 - Non-pressure chronic ulcer of right calf with fat layer exposed L97.222 - Non-pressure chronic ulcer of left calf with fat layer exposed E66.01 - Morbid (severe)  obesity due to excess calories I89.0 - Lymphedema, not elsewhere classified He's had his first layer of Apligraf applied and it has been fixed with the usual precautions and he'll have an Unna's boot applied over this. He will come back and see me next week for a wound check. Procedures Wound #1 Wound #1 is a Venous Leg Ulcer located on the Right,Lateral Lower Leg. A skin graft procedure using a bioengineered skin substitute/cellular or tissue based product was performed by Faust Thorington, Ignacia Felling., MD. Apligraf was applied and secured with Steri-Strips. 5 sq cm of product was utilized and 39 sq cm was wasted due to wound size. Post Application, mepitel one was applied. A Time Out was conducted prior to the start of the procedure. The procedure was tolerated well with a pain level of 0 throughout and a pain level of 0 following the procedure. Plan Wound Cleansing: Wound #1 Right,Lateral Lower Leg: [place], Kc C. ([id number]) Cleanse wound with mild soap and water Anesthetic: Wound #1 Right,Lateral Lower Leg: Topical Lidocaine 4% cream applied to wound bed prior to  debridement Primary Wound Dressing: Drawtex Dressing Change Frequency: Wound #1 Right,Lateral Lower Leg: Change dressing every week Follow-up Appointments: Wound #1 Right,Lateral Lower Leg: Return Appointment in 1 week. Edema Control: Unna Boot to Right Lower Extremity Advanced Therapies: Wound #1 Right,Lateral Lower Leg: Apligraf application in clinic; including contact layer, fixation with steri strips, dry gauze and cover dressing. He's had his first layer of Apligraf applied and it has been fixed with the usual precautions and he'll have an Unna's boot applied over this. He will come back and see me next week for a wound check. Electronic Signature(s) Signed: 06/11/2015 10:29:38 AM By: Evlyn Kanner MD, FACS Entered By: Evlyn Kanner on 06/11/2015 10:29:38 Mogan, [name]  ([id number]) -------------------------------------------------------------------------------- SuperBill Details Patient Name: Oscar Mirza C. Date of Service: 06/11/2015 Medical Record Number: [id number] Patient Account Number: [id number] Date of Birth/Sex: 12-28-1970 (44 y.o. Male) Treating RN: Curtis Sites Primary Care Physician: Mayford Knife, ERIC Other Clinician: Referring Physician: Mayford Knife, ERIC Treating Physician/Extender: Rudene Re in Treatment: 4 Diagnosis Coding ICD-10 Codes Code Description I87.313 Chronic venous hypertension (idiopathic) with ulcer of bilateral lower extremity L97.212 Non-pressure chronic ulcer of right calf with fat layer exposed L97.222 Non-pressure chronic ulcer of left calf with fat layer exposed E66.01 Morbid (severe) obesity due to excess calories I89.0 Lymphedema, not elsewhere classified Facility Procedures CPT4: Description Modifier Quantity Code [id number] (Facility Use Only) Apligraf 1 SQ CM 44 ICD-10 Description Diagnosis I87.313 Chronic venous hypertension (idiopathic) with ulcer of bilateral lower extremity L97.212 Non-pressure chronic ulcer of right  calf with fat layer exposed CPT4: [id number] - SKIN SUB GRAFT TRNK/ARM/LEG 1 ICD-10 Description Diagnosis I87.313 Chronic venous hypertension (idiopathic) with ulcer of bilateral lower extremity L97.212 Non-pressure chronic ulcer of right calf with fat layer exposed Physician Procedures CPT4: Description Modifier Quantity Code [id number] 15271 - WC PHYS SKIN SUB GRAFT TRNK/ARM/LEG 1 ICD-10 Description Diagnosis I87.313 Chronic venous hypertension (idiopathic) with ulcer of bilateral lower extremity L97.212 Non-pressure chronic ulcer of  right calf with fat layer exposed Souders, Trooper C. ([id number]) Electronic Signature(s) Signed: 06/11/2015 10:30:13 AM By: Evlyn Kanner MD, FACS Entered By: Evlyn Kanner on 06/11/2015 10:30:12

## 2015-06-12 NOTE — Progress Notes (Signed)
KEE, DRUDGE ([id number]) Visit Report for 06/11/2015 Arrival Information Details Patient Name: [name], [name]. Date of Service: 06/11/2015 8:45 AM Medical Record Number: [id number] Patient Account Number: [id number] Date of Birth/Sex: 11-08-1970 (44 y.o. Male) Treating RN: Huel Coventry Primary Care Physician: Mayford Knife, ERIC Other Clinician: Referring Physician: Mayford Knife, ERIC Treating Physician/Extender: Rudene Re in Treatment: 4 Visit Information History Since Last Visit Added or deleted any medications: No Patient Arrived: Ambulatory Any new allergies or adverse reactions: No Arrival Time: 09:13 Had a fall or experienced change in No Accompanied By: wife activities of daily living that may affect Transfer Assistance: None risk of falls: Patient Identification Verified: Yes Signs or symptoms of abuse/neglect since last No Secondary Verification Process Yes visito Completed: Hospitalized since last visit: No Patient Has Alerts: Yes Has Dressing in Place as Prescribed: Yes Patient Alerts: Patient on Blood Pain Present Now: No Thinner Electronic Signature(s) Signed: 06/11/2015 6:31:19 PM By: Elliot Gurney, RN, BSN, Kim RN, BSN Entered By: Elliot Gurney, RN, BSN, Kim on 06/11/2015 09:13:33 Weyand, [name] ([id number]) -------------------------------------------------------------------------------- Encounter Discharge Information Details Patient Name: Oscar Mirza C. Date of Service: 06/11/2015 8:45 AM Medical Record Number: [id number] Patient Account Number: [id number] Date of Birth/Sex: 1971-03-26 (44 y.o. Male) Treating RN: Curtis Sites Primary Care Physician: Mayford Knife, ERIC Other Clinician: Referring Physician: Mayford Knife, ERIC Treating Physician/Extender: Rudene Re in Treatment: 4 Encounter Discharge Information Items Discharge Pain Level: 0 Discharge Condition: Stable Ambulatory Status: Ambulatory Discharge Destination: Home Transportation: Private  Auto Accompanied By: self Schedule Follow-up Appointment: Yes Medication Reconciliation completed and provided to Patient/Care Yes Lumina Gitto: Provided on Clinical Summary of Care: 06/11/2015 Form Type Recipient Paper Patient EM Electronic Signature(s) Signed: 06/11/2015 6:31:19 PM By: Elliot Gurney RN, BSN, Kim RN, BSN Previous Signature: 06/11/2015 9:54:06 AM Version By: Gwenlyn Perking Entered By: Elliot Gurney RN, BSN, Kim on 06/11/2015 09:56:48 Italiano, [name] ([id number]) -------------------------------------------------------------------------------- Lower Extremity Assessment Details Patient Name: [name], [name] Date of Service: 06/11/2015 8:45 AM Medical Record Number: [id number] Patient Account Number: [id number] Date of Birth/Sex: Apr 29, 1971 (44 y.o. Male) Treating RN: Huel Coventry Primary Care Physician: Mayford Knife, ERIC Other Clinician: Referring Physician: Mayford Knife, ERIC Treating Physician/Extender: Rudene Re in Treatment: 4 Edema Assessment Assessed: [Left: No] [Right: No] E[Left: dema] [Right: :] Calf Left: Right: Point of Measurement: 33 cm From Medial Instep cm 46 cm Ankle Left: Right: Point of Measurement: 11 cm From Medial Instep cm 30.5 cm Vascular Assessment Pulses: Posterior Tibial Dorsalis Pedis Palpable: [Right:Yes] Extremity colors, hair growth, and conditions: Extremity Color: [Right:Normal] Hair Growth on Extremity: [Right:No] Temperature of Extremity: [Right:Warm] Capillary Refill: [Right:< 3 seconds] Toe Nail Assessment Left: Right: Thick: Yes Discolored: Yes Deformed: No Improper Length and Hygiene: No Electronic Signature(s) Signed: 06/11/2015 6:31:19 PM By: Elliot Gurney, RN, BSN, Kim RN, BSN Entered By: Elliot Gurney, RN, BSN, Kim on 06/11/2015 09:21:36 Anastasia, [name] ([id number]) -------------------------------------------------------------------------------- Multi Wound Chart Details Patient Name: Oscar Mirza C. Date of Service: 06/11/2015 8:45  AM Medical Record Number: [id number] Patient Account Number: [id number] Date of Birth/Sex: 1971-05-21 (44 y.o. Male) Treating RN: Huel Coventry Primary Care Physician: Mayford Knife, ERIC Other Clinician: Referring Physician: Mayford Knife, ERIC Treating Physician/Extender: Rudene Re in Treatment: 4 Vital Signs Height(in): 72 Pulse(bpm): 90 Weight(lbs): 386 Blood Pressure 159/81 (mmHg): Body Mass Index(BMI): 52 Temperature(F): 98.1 Respiratory Rate 18 (breaths/min): Photos: [1:No Photos] [N/A:N/A] Wound Location: [1:Right Lower Leg - Lateral N/A] Wounding Event: [1:Gradually Appeared] [N/A:N/A] Primary Etiology: [1:Venous Leg Ulcer] [N/A:N/A] Comorbid History: [1:Sleep Apnea, Rheumatoid N/A Arthritis] Date Acquired: [1:01/26/2015] [N/A:N/A] Weeks of Treatment: [1:4] [  N/A:N/A] Wound Status: [1:Open] [N/A:N/A] Measurements L x W x D 2x2.5x0.1 [N/A:N/A] (cm) Area (cm) : [1:3.927] [N/A:N/A] Volume (cm) : [1:0.393] [N/A:N/A] % Reduction in Area: [1:60.30%] [N/A:N/A] % Reduction in Volume: 60.30% [N/A:N/A] Classification: [1:Full Thickness Without Exposed Support Structures] [N/A:N/A] Exudate Amount: [1:Large] [N/A:N/A] Exudate Type: [1:Serous] [N/A:N/A] Exudate Color: [1:amber] [N/A:N/A] Wound Margin: [1:Indistinct, nonvisible] [N/A:N/A] Granulation Amount: [1:Small (1-33%)] [N/A:N/A] Granulation Quality: [1:Pink, Hyper-granulation N/A] Necrotic Amount: [1:Large (67-100%)] [N/A:N/A] Exposed Structures: [1:Fascia: No Fat: No Tendon: No Muscle: No Joint: No] [N/A:N/A] Bone: No Limited to Skin Breakdown Epithelialization: Small (1-33%) N/A N/A Periwound Skin Texture: No Abnormalities Noted N/A N/A Periwound Skin Maceration: Yes N/A N/A Moisture: Moist: Yes Periwound Skin Color: No Abnormalities Noted N/A N/A Temperature: No Abnormality N/A N/A Tenderness on Yes N/A N/A Palpation: Wound Preparation: Ulcer Cleansing: N/A N/A Rinsed/Irrigated with Saline Topical  Anesthetic Applied: Other: lidociane 4% Treatment Notes Electronic Signature(s) Signed: 06/11/2015 6:31:19 PM By: Elliot Gurney, RN, BSN, Kim RN, BSN Entered By: Elliot Gurney, RN, BSN, Kim on 06/11/2015 09:55:34 Tomich, [name] ([id number]) -------------------------------------------------------------------------------- Multi-Disciplinary Care Plan Details Patient Name: Dubey, Harrel C. Date of Service: 06/11/2015 8:45 AM Medical Record Number: [id number] Patient Account Number: [id number] Date of Birth/Sex: 1971-03-20 (44 y.o. Male) Treating RN: Huel Coventry Primary Care Physician: Mayford Knife, ERIC Other Clinician: Referring Physician: Mayford Knife, ERIC Treating Physician/Extender: Rudene Re in Treatment: 4 Active Inactive Orientation to the Wound Care Program Nursing Diagnoses: Knowledge deficit related to the wound healing center program Goals: Patient/caregiver will verbalize understanding of the Wound Healing Center Program Date Initiated: 05/14/2015 Goal Status: Active Interventions: Provide education on orientation to the wound center Notes: Venous Leg Ulcer Nursing Diagnoses: Potential for venous Insuffiency (use before diagnosis confirmed) Goals: Non-invasive venous studies are completed as ordered Date Initiated: 05/14/2015 Goal Status: Active Interventions: Assess peripheral edema status every visit. Treatment Activities: Non-invasive vascular studies : 06/11/2015 Notes: Wound/Skin Impairment Nursing Diagnoses: Impaired tissue integrity Lehrmann, Santhosh C. ([id number]) Goals: Patient/caregiver will verbalize understanding of skin care regimen Date Initiated: 05/14/2015 Goal Status: Active Ulcer/skin breakdown will heal within 14 weeks Date Initiated: 05/14/2015 Goal Status: Active Interventions: Assess patient/caregiver ability to obtain necessary supplies Notes: Electronic Signature(s) Signed: 06/11/2015 6:31:19 PM By: Elliot Gurney, RN, BSN, Kim RN, BSN Entered By: Elliot Gurney,  RN, BSN, Kim on 06/11/2015 09:28:56 Krass, [name] ([id number]) -------------------------------------------------------------------------------- Pain Assessment Details Patient Name: [name], [name] Date of Service: 06/11/2015 8:45 AM Medical Record Number: [id number] Patient Account Number: [id number] Date of Birth/Sex: [birth year] (44 y.o. Male) Treating RN: Huel Coventry Primary Care Physician: Mayford Knife, ERIC Other Clinician: Referring Physician: Mayford Knife, ERIC Treating Physician/Extender: Rudene Re in Treatment: 4 Active Problems Location of Pain Severity and Description of Pain Patient Has Paino No Site Locations Pain Management and Medication Current Pain Management: Electronic Signature(s) Signed: 06/11/2015 6:31:19 PM By: Elliot Gurney, RN, BSN, Kim RN, BSN Entered By: Elliot Gurney, RN, BSN, Kim on 06/11/2015 09:13:39 Macquarrie, [name] ([id number]) -------------------------------------------------------------------------------- Patient/Caregiver Education Details Patient Name: [name]. Date of Service: 06/11/2015 8:45 AM Medical Record Number: [id number] Patient Account Number: [id number] Date of Birth/Gender: 12/22/1970 (44 y.o. Male) Treating RN: Huel Coventry Primary Care Physician: Mayford Knife, ERIC Other Clinician: Referring Physician: Mayford Knife, ERIC Treating Physician/Extender: Rudene Re in Treatment: 4 Education Assessment Education Provided To: Patient Education Topics Provided Wound/Skin Impairment: Handouts: Other: apligraf applied, continue wound are as prescribed Electronic Signature(s) Signed: 06/11/2015 6:31:19 PM By: Elliot Gurney, RN, BSN, Kim RN, BSN Entered By: Elliot Gurney, RN, BSN, Kim on 06/11/2015 09:57:14 Hao, [name] ([id number]) -------------------------------------------------------------------------------- Wound  Assessment Details Patient Name: [name], [name] Date of Service: 06/11/2015 8:45 AM Medical Record Number: [id number] Patient  Account Number: [id number] Date of Birth/Sex: 04-13-1971 (44 y.o. Male) Treating RN: Huel Coventry Primary Care Physician: Mayford Knife, ERIC Other Clinician: Referring Physician: Mayford Knife, ERIC Treating Physician/Extender: Rudene Re in Treatment: 4 Wound Status Wound Number: 1 Primary Etiology: Venous Leg Ulcer Wound Location: Right Lower Leg - Lateral Wound Status: Open Wounding Event: Gradually Appeared Comorbid Sleep Apnea, Rheumatoid History: Arthritis Date Acquired: 01/26/2015 Weeks Of Treatment: 4 Clustered Wound: No Photos Photo Uploaded By: Elliot Gurney, RN, BSN, Kim on 06/11/2015 18:28:57 Wound Measurements Length: (cm) 2 Width: (cm) 2.5 Depth: (cm) 0.1 Area: (cm) 3.927 Volume: (cm) 0.393 % Reduction in Area: 60.3% % Reduction in Volume: 60.3% Epithelialization: Small (1-33%) Wound Description Full Thickness Without Exposed Classification: Support Structures Wound Margin: Indistinct, nonvisible Exudate Large Amount: Exudate Type: Serous Exudate Color: amber Wound Bed Granulation Amount: Small (1-33%) Exposed Structure Granulation Quality: Pink, Hyper-granulation Fascia Exposed: No Necrotic Amount: Large (67-100%) Fat Layer Exposed: No Kirsten, Andri C. ([id number]) Necrotic Quality: Adherent Slough Tendon Exposed: No Muscle Exposed: No Joint Exposed: No Bone Exposed: No Limited to Skin Breakdown Periwound Skin Texture Texture Color No Abnormalities Noted: No No Abnormalities Noted: No Moisture Temperature / Pain No Abnormalities Noted: No Temperature: No Abnormality Maceration: Yes Tenderness on Palpation: Yes Moist: Yes Wound Preparation Ulcer Cleansing: Rinsed/Irrigated with Saline Topical Anesthetic Applied: Other: lidociane 4%, Treatment Notes Wound #1 (Right, Lateral Lower Leg) 1. Cleansed with: Cleanse wound with antibacterial soap and water 2. Anesthetic Topical Lidocaine 4% cream to wound bed prior to debridement 7. Secured with Henriette Combs to Right Lower Extremity Notes drawtex over [employer]) Signed: 06/11/2015 6:31:19 PM By: Elliot Gurney, RN, BSN, Kim RN, BSN Entered By: Elliot Gurney, RN, BSN, Kim on 06/11/2015 09:25:39 Sokolowski, [name] ([id number]) -------------------------------------------------------------------------------- Vitals Details Patient Name: Oscar Mirza C. Date of Service: 06/11/2015 8:45 AM Medical Record Number: [id number] Patient Account Number: [id number] Date of Birth/Sex: 07/08/1971 (44 y.o. Male) Treating RN: Huel Coventry Primary Care Physician: Mayford Knife, ERIC Other Clinician: Referring Physician: Mayford Knife, ERIC Treating Physician/Extender: Rudene Re in Treatment: 4 Vital Signs Time Taken: 09:20 Temperature (F): 98.1 Height (in): 72 Pulse (bpm): 90 Weight (lbs): 386 Respiratory Rate (breaths/min): 18 Body Mass Index (BMI): 52.3 Blood Pressure (mmHg): 159/81 Reference Range: 80 - 120 mg / dl Electronic Signature(s) Signed: 06/11/2015 6:31:19 PM By: Elliot Gurney, RN, BSN, Kim RN, BSN Entered By: Elliot Gurney, RN, BSN, Kim on 06/11/2015 09:20:17

## 2015-06-18 ENCOUNTER — Encounter: Payer: No Typology Code available for payment source | Admitting: Surgery

## 2015-06-18 DIAGNOSIS — L97212 Non-pressure chronic ulcer of right calf with fat layer exposed: Secondary | ICD-10-CM | POA: Diagnosis not present

## 2015-06-19 NOTE — Progress Notes (Signed)
AMIR, FICK ([id number]) Visit Report for 06/18/2015 Arrival Information Details Patient Name: [name], [name]. Date of Service: 06/18/2015 9:30 AM Medical Record Number: [id number] Patient Account Number: [id number] Date of Birth/Sex: [name] (44 y.o. Male) Treating RN: [name] Primary Care Physician: [name] Other Clinician: Referring Physician: Mayford Knife, [name] Treating Physician/Extender: [name] in Treatment: 5 Visit Information History Since Last Visit Added or deleted any medications: No Patient Arrived: Ambulatory Any new allergies or adverse reactions: No Arrival Time: 09:39 Had a fall or experienced change in No Accompanied By: SPOUSE activities of daily living that may affect Transfer Assistance: None risk of falls: Patient Identification Verified: Yes Signs or symptoms of abuse/neglect since last No Secondary Verification Process Yes visito Completed: Hospitalized since last visit: No Patient Has Alerts: Yes Pain Present Now: No Patient Alerts: Patient on Blood Thinner Electronic Signature(s) Signed: 06/18/2015 5:27:36 PM By: [name] Entered By: [name] on 06/18/2015 09:39:24 [name] ([id number]) -------------------------------------------------------------------------------- Encounter Discharge Information Details Patient Name: Oscar Mirza C. Date of Service: 06/18/2015 9:30 AM Medical Record Number: [id number] Patient Account Number: [id number] Date of Birth/Sex: [name] (44 y.o. Male) Treating RN: [name] Primary Care Physician: [name] Other Clinician: Referring Physician: Mayford Knife, [name] Treating Physician/Extender: [name] in Treatment: 5 Encounter Discharge Information Items Discharge Pain Level: 0 Discharge Condition: Stable Ambulatory Status: Walker Discharge Destination: Home Transportation: Private Auto Accompanied By: spouse Schedule Follow-up Appointment:  Yes Medication Reconciliation completed No and provided to Patient/Care [name]: Provided on Clinical Summary of Care: 06/18/2015 Form Type Recipient Paper Patient EM Electronic Signature(s) Signed: 06/18/2015 10:14:48 AM By: [name] Entered By: [name] on 06/18/2015 10:14:48 Dimichele, April Salena [name] ([id number]) -------------------------------------------------------------------------------- Lower Extremity Assessment Details Patient Name: [name], [name] Date of Service: 06/18/2015 9:30 AM Medical Record Number: [id number] Patient Account Number: [id number] Date of Birth/Sex: [name] (44 y.o. Male) Treating RN: [name] Primary Care Physician: [name] Other Clinician: Referring Physician: Mayford Knife, [name] Treating Physician/Extender: [name] in Treatment: 5 Edema Assessment Assessed: [Left: No] [Right: No] Edema: [Left: Ye] [Right: s] Calf Left: Right: Point of Measurement: 33 cm From Medial Instep cm 45.5 cm Ankle Left: Right: Point of Measurement: 11 cm From Medial Instep cm 30.2 cm Vascular Assessment Pulses: Posterior Tibial Dorsalis Pedis Palpable: [Right:Yes] Extremity colors, hair growth, and conditions: Extremity Color: [Right:Hyperpigmented] Hair Growth on Extremity: [Right:No] Temperature of Extremity: [Right:Warm] Capillary Refill: [Right:< 3 seconds] Toe Nail Assessment Left: Right: Thick: Yes Discolored: Yes Deformed: No Improper Length and Hygiene: Yes Electronic Signature(s) Signed: 06/18/2015 5:27:36 PM By: [name] Entered By: [name] on 06/18/2015 09:47:04 [name], [name] ([id number]) -------------------------------------------------------------------------------- Multi Wound Chart Details Patient Name: Oscar Mirza C. Date of Service: 06/18/2015 9:30 AM Medical Record Number: [id number] Patient Account Number: [id number] Date of Birth/Sex: [name] (44 y.o. Male) Treating RN: [name] Primary Care Physician: [name] Other Clinician: Referring Physician: Mayford Knife, [name] Treating Physician/Extender: [name] in Treatment: 5 Vital Signs Height(in): 72 Pulse(bpm): 77 Weight(lbs): 386 Blood Pressure 125/57 (mmHg): Body Mass Index(BMI): 52 Temperature(F): 98.2 Respiratory Rate 18 (breaths/min): Wound Assessments Treatment Notes Electronic Signature(s) Signed: 06/18/2015 5:27:36 PM By: [name] Entered By: [name] on 06/18/2015 09:53:04 [name] ([id number]) -------------------------------------------------------------------------------- Multi-Disciplinary Care Plan Details Patient Name: Oscar Mirza C. Date of Service: 06/18/2015 9:30 AM Medical Record Number: [id number] Patient Account Number: [id number] Date of Birth/Sex: [name] (44 y.o. Male) Treating RN: [name] Primary Care Physician: [name] Other Clinician: Referring Physician: Mayford Knife, [name] Treating  Physician/Extender: [name] in Treatment: 5 Active Inactive Orientation to the Wound Care Program Nursing Diagnoses: Knowledge deficit related to the wound healing center program Goals: Patient/caregiver will verbalize understanding of the Wound Healing Center Program Date Initiated: 05/14/2015 Goal Status: Active Interventions: Provide education on orientation to the wound center Notes: Venous Leg Ulcer Nursing Diagnoses: Potential for venous Insuffiency (use before diagnosis confirmed) Goals: Non-invasive venous studies are completed as ordered Date Initiated: 05/14/2015 Goal Status: Active Interventions: Assess peripheral edema status every visit. Treatment Activities: Non-invasive vascular studies : 06/18/2015 Notes: Wound/Skin Impairment Nursing Diagnoses: Impaired tissue integrity [name], [name] ([id number]) Goals: Patient/caregiver will verbalize understanding of skin care regimen Date Initiated:  05/14/2015 Goal Status: Active Ulcer/skin breakdown will heal within 14 weeks Date Initiated: 05/14/2015 Goal Status: Active Interventions: Assess patient/caregiver ability to obtain necessary supplies Notes: Electronic Signature(s) Signed: 06/18/2015 5:27:36 PM By: [name] Entered By: [name] on 06/18/2015 09:52:58 Coverdale, [name] ([id number]) -------------------------------------------------------------------------------- Patient/Caregiver Education Details Patient Name: [name]. Date of Service: 06/18/2015 9:30 AM Medical Record Number: [id number] Patient Account Number: [id number] Date of Birth/Gender: [name] (44 y.o. Male) Treating RN: [name] Primary Care Physician: [name] Other Clinician: Referring Physician: Mayford Knife, [name] Treating Physician/Extender: [name] in Treatment: 5 Education Assessment Education Provided To: Patient Education Topics Provided Venous: Handouts: Other: come in for nurse visit if needee before next Thursday Methods: Explain/Verbal Responses: State content correctly Electronic Signature(s) Signed: 06/18/2015 5:27:36 PM By: [name] Entered By: [name] on 06/18/2015 09:58:44 Pillsbury, Knut C. ([id number]) -------------------------------------------------------------------------------- Vitals Details Patient Name: Oscar Mirza C. Date of Service: 06/18/2015 9:30 AM Medical Record Number: [id number] Patient Account Number: [id number] Date of Birth/Sex: [name] (44 y.o. Male) Treating RN: [name] Primary Care Physician: [name] Other Clinician: Referring Physician: Mayford Knife, [name] Treating Physician/Extender: [name] in Treatment: 5 Vital Signs Time Taken: 09:39 Temperature (F): 98.2 Height (in): 72 Pulse (bpm): 77 Weight (lbs): 386 Respiratory Rate (breaths/min): 18 Body Mass Index (BMI): 52.3 Blood Pressure (mmHg): 125/57 Reference Range: 80 -  120 mg / dl Electronic Signature(s) Signed: 06/18/2015 5:27:36 PM By: [name] Entered By: [name] on 06/18/2015 09:40:41

## 2015-06-19 NOTE — Progress Notes (Signed)
Oscar, [name] ([id number]) Visit Report for 06/18/2015 Chief Complaint Document Details Patient Name: [name], [name]. Date of Service: 06/18/2015 9:30 AM Medical Record Number: [id number] Patient Account Number: [id number] Date of Birth/Sex: 1970/11/13 (44 y.o. Male) Treating RN: Curtis Sites Primary Care Physician: Mayford Knife, ERIC Other Clinician: Referring Physician: Mayford Knife, ERIC Treating Physician/Extender: Rudene Re in Treatment: 5 Information Obtained from: Patient Chief Complaint Patient presents for treatment of an open ulcer due to venous insufficiency. 44 year old gentleman who has bilateral lower leg swelling and ulceration for the last 3 months. Electronic Signature(s) Signed: 06/18/2015 10:15:11 AM By: Evlyn Kanner MD, FACS Entered By: Evlyn Kanner on 06/18/2015 10:15:11 Lovvorn, [name] ([id number]) -------------------------------------------------------------------------------- HPI Details Patient Name: Oscar Mirza C. Date of Service: 06/18/2015 9:30 AM Medical Record Number: [id number] Patient Account Number: [id number] Date of Birth/Sex: 05/17/1971 (44 y.o. Male) Treating RN: Curtis Sites Primary Care Physician: Mayford Knife, ERIC Other Clinician: Referring Physician: Mayford Knife, ERIC Treating Physician/Extender: Rudene Re in Treatment: 5 History of Present Illness Location: bilateral lower extremity swelling and ulceration Quality: Patient reports experiencing a dull pain to affected area(s). Severity: Patient states wound are getting worse. Duration: Patient has had the wound for > 3 months prior to seeking treatment at the wound center Timing: Pain in wound is Intermittent (comes and goes Context: The wound appeared gradually over time Modifying Factors: Other treatment(s) tried include: has seen his PCP and has been on Keflex. Associated Signs and Symptoms: Patient reports having difficulty standing for long periods. HPI Description:  This 45 year old gentleman has been referred from his PCPs office where he was seen by Esperanza Sheets, PA. The patient is known to be seen most recently for morbidly obese, sleep apnea, chronic lower extremity edema, osteoarthritis, pain in the legs. His past medical history is significant for chronic venous insufficiency, pain in the back, morbid obesity, cardiomegaly, osteoarthritis of the hip, sleep apnea, kidney stones, hematuria, ulcers both lower extremities. He had a left hip arthroplasty in April of this year. He has never had venous duplex studies done on his lower extremities though he has been to the wound center several years ago. He has also not been wearing his compression stockings. 05/21/2015. He has been compliant with elevation of the limb and wearing his sleep apnea machine so that he can breathe better and try and get to sleep in a bed. His vascular studies as scheduled for early August. 05/28/2015 -- he has his vascular appointment tomorrow afternoon and other than that has been doing very well. 06/04/2015 -- he had his vascular studies done last week and he goes back for them to be red and to get the opinion of the vascular surgeon. His insurance has not given him the Jobst stocking yet and he is otherwise doing fine. 06/11/2015 -- he did visit the vascular surgeons and though we don't have the official report the patient says that his study was complete and no surgical intervention was recommended the present time. He had compression stockings recommended for his venous hypertension and at some stage if he continued to have problems they would recommended lymphedema pumps. We will await official report regarding his vascular workup. In the meanwhile his wound is doing well and he has insurance approval for Apligraf and we will proceed with the first application of Apligraf today. 06/18/2015 -- Seen by Dr. Gilda Crease on 06/09/2015. Duplex ultrasound of the lower extremity  showed normal deep venous system, superficial reflux was not present on the left and in a trivial segment  on the right. Recommendations were for 20-30 mmHg compression stockings and at some stage later may be lymphedema pumps. DAVIDE, RISDON ([id number]) Electronic Signature(s) Signed: 06/18/2015 10:52:01 AM By: Evlyn Kanner MD, FACS Previous Signature: 06/18/2015 10:15:17 AM Version By: Evlyn Kanner MD, FACS Entered By: Evlyn Kanner on 06/18/2015 10:52:00 Griesinger, [name] ([id number]) -------------------------------------------------------------------------------- Physical Exam Details Patient Name: Oscar, Fenton C. Date of Service: 06/18/2015 9:30 AM Medical Record Number: [id number] Patient Account Number: [id number] Date of Birth/Sex: 05-20-1971 (44 y.o. Male) Treating RN: Curtis Sites Primary Care Physician: Mayford Knife, ERIC Other Clinician: Referring Physician: Mayford Knife, ERIC Treating Physician/Extender: Rudene Re in Treatment: 5 Constitutional . Pulse regular. Respirations normal and unlabored. Afebrile. . Eyes Nonicteric. Reactive to light. Ears, Nose, Mouth, and Throat Lips, teeth, and gums WNL.Marland Kitchen Moist mucosa without lesions . Neck supple and nontender. No palpable supraclavicular or cervical adenopathy. Normal sized without goiter. Respiratory WNL. No retractions.. Cardiovascular Pedal Pulses WNL. No clubbing, cyanosis or edema. Chest Breasts symmetical and no nipple discharge.. Breast tissue WNL, no masses, lumps, or tenderness.. Lymphatic No adneopathy. No adenopathy. No adenopathy. Musculoskeletal Adexa without tenderness or enlargement.. Digits and nails w/o clubbing, cyanosis, infection, petechiae, ischemia, or inflammatory conditions.. Integumentary (Hair, Skin) No suspicious lesions. No crepitus or fluctuance. No peri-wound warmth or erythema. No masses.Marland Kitchen Psychiatric Judgement and insight Intact.. No evidence of depression, anxiety, or  agitation.. Notes The wound has the Apligraf dressing in place and the rest of the leg looks great. We will continue with his own Unna's boot and reapply the second Apligraf next week. Electronic Signature(s) Signed: 06/18/2015 10:15:58 AM By: Evlyn Kanner MD, FACS Entered By: Evlyn Kanner on 06/18/2015 10:15:57 Pick, [name] ([id number]) -------------------------------------------------------------------------------- Physician Orders Details Patient Name: Oscar Mirza C. Date of Service: 06/18/2015 9:30 AM Medical Record Number: [id number] Patient Account Number: [id number] Date of Birth/Sex: 1971-05-15 (44 y.o. Male) Treating RN: Curtis Sites Primary Care Physician: Mayford Knife, ERIC Other Clinician: Referring Physician: Mayford Knife, ERIC Treating Physician/Extender: Rudene Re in Treatment: 5 Verbal / Phone Orders: Yes Clinician: Curtis Sites Read Back and Verified: Yes Diagnosis Coding Wound Cleansing Wound #1 Right,Lateral Lower Leg o Cleanse wound with mild soap and water Anesthetic Wound #1 Right,Lateral Lower Leg o Topical Lidocaine 4% cream applied to wound bed prior to debridement Primary Wound Dressing o Drawtex Dressing Change Frequency Wound #1 Right,Lateral Lower Leg o Change dressing every week Follow-up Appointments Wound #1 Right,Lateral Lower Leg o Return Appointment in 1 week. - next Thursday Edema Control o Unna Boot to Right Lower Extremity Notes order Apligraf for Thursday of next week Electronic Signature(s) Signed: 06/18/2015 4:35:29 PM By: Evlyn Kanner MD, FACS Signed: 06/18/2015 5:27:36 PM By: Curtis Sites Entered By: Curtis Sites on 06/18/2015 09:56:23 Bobb, Zyrion Salena Saner ([id number]) -------------------------------------------------------------------------------- Problem List Details Patient Name: Denbleyker, Criston C. Date of Service: 06/18/2015 9:30 AM Medical Record Number: [id number] Patient Account Number:  [id number] Date of Birth/Sex: January 28, 1971 (44 y.o. 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 Date of Service: 06/18/2015 9:30 AM Medical Record Number: [id number] Patient Account Number: [id number] Date of Birth/Sex: 28-Apr-1971 (44 y.o. Male) Treating RN: Curtis Sites Primary Care Physician: Mayford Knife, ERIC Other Clinician: Referring Physician: Mayford Knife, ERIC Treating Physician/Extender: Rudene Re in Treatment: 5 Subjective Chief Complaint Information obtained from Patient Patient presents for treatment of an open ulcer due to venous insufficiency. 44 year old gentleman who has bilateral lower leg swelling and ulceration for the last 3 months. History of Present Illness (HPI) The following HPI elements were documented for the patient's wound: Location: bilateral lower extremity swelling and ulceration Quality: Patient reports experiencing a dull pain to affected area(s). Severity: Patient states wound are getting worse. Duration:  Patient has had the wound for > 3 months prior to seeking treatment at the wound center Timing: Pain in wound is Intermittent (comes and goes Context: The wound appeared gradually over time Modifying Factors: Other treatment(s) tried include: has seen his PCP and has been on Keflex. Associated Signs and Symptoms: Patient reports having difficulty standing for long periods. This 44 year old gentleman has been referred from his PCPs office where he was seen by Esperanza Sheets, PA. The patient is known to be seen most recently for morbidly obese, sleep apnea, chronic lower extremity edema, osteoarthritis, pain in the legs. His past medical history is significant for chronic venous insufficiency, pain in the back, morbid obesity, cardiomegaly, osteoarthritis of the hip, sleep apnea, kidney stones, hematuria, ulcers both lower extremities. He had a left hip arthroplasty in April of this year. He has never had venous duplex studies done on his lower extremities though he has been to the wound center several years ago. He has also not been wearing his compression stockings. 05/21/2015. He has been compliant with elevation of the limb and wearing his sleep apnea machine so that he can breathe better and try and get to sleep in a bed. His vascular studies as scheduled for early August. 05/28/2015 -- he has his vascular appointment tomorrow afternoon and other than that has been doing very well. 06/04/2015 -- he had his vascular studies done last week and he goes back for them to be red and to get the opinion of the vascular surgeon. His insurance has not given him the Jobst stocking yet and he is otherwise doing fine. 06/11/2015 -- he did visit the vascular surgeons and though we don't have the official report the patient says that his study was complete and no surgical intervention was recommended the present time. He had compression stockings recommended for his venous hypertension and at some stage if he  continued to have problems they would recommended lymphedema pumps. We will await official report regarding his vascular workup. Rosenzweig, Sahir C. ([id number]) In the meanwhile his wound is doing well and he has insurance approval for Apligraf and we will proceed with the first application of Apligraf today. 06/18/2015 -- Seen by Dr. Gilda Crease on 06/09/2015. Duplex ultrasound of the lower extremity showed normal deep venous system, superficial reflux was not present on the left and in a trivial segment on the right. Recommendations were for 20-30 mmHg compression stockings and at some stage later may be lymphedema pumps. Objective Constitutional Pulse regular. Respirations normal and unlabored. Afebrile. Vitals Time Taken: 9:39 AM, Height: 72 in, Weight: 386 lbs, BMI: 52.3, Temperature: 98.2 F, Pulse: 77 bpm, Respiratory Rate: 18 breaths/min, Blood  Pressure: 125/57 mmHg. Eyes Nonicteric. Reactive to light. Ears, Nose, Mouth, and Throat Lips, teeth, and gums WNL.Marland Kitchen Moist mucosa without lesions . Neck supple and nontender. No palpable supraclavicular or cervical adenopathy. Normal sized without goiter. Respiratory WNL. No retractions.. Cardiovascular Pedal Pulses WNL. No clubbing, cyanosis or edema. Chest Breasts symmetical and no nipple discharge.. Breast tissue WNL, no masses, lumps, or tenderness.. Lymphatic No adneopathy. No adenopathy. No adenopathy. Musculoskeletal Adexa without tenderness or enlargement.. Digits and nails w/o clubbing, cyanosis, infection, petechiae, ischemia, or inflammatory conditions.Marland Kitchen Psychiatric Judgement and insight Intact.. No evidence of depression, anxiety, or agitation.Marland Kitchen Harbison, Chidiebere C. ([id number]) General Notes: The wound has the Apligraf dressing in place and the rest of the leg looks great. We will continue with his own Unna's boot and reapply the second Apligraf next week. Integumentary (Hair, Skin) No suspicious lesions. No crepitus or  fluctuance. No peri-wound warmth or erythema. No masses.. 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 We'll continue with elevation and all other supportive care. Plan Wound Cleansing: Wound #1 Right,Lateral Lower Leg: Cleanse wound with mild soap and water Anesthetic: Wound #1 Right,Lateral Lower Leg: Topical Lidocaine 4% cream applied to wound bed prior to debridement Primary Wound Dressing: Drawtex Dressing Change Frequency: Wound #1 Right,Lateral Lower Leg: Change dressing every week Follow-up Appointments: Wound #1 Right,Lateral Lower Leg: Return Appointment in 1 week. - next Thursday Edema Control: Unna Boot to Right Lower Extremity Hearty, Windel C. ([id number]) General Notes: order Apligraf for Thursday of next week Having checked his wound we will go ahead and apply a Unna's boot again today and order the second Apligraf for next week. We'll continue with elevation and all other supportive care. Electronic Signature(s) Signed: 06/18/2015 10:52:21 AM By: Evlyn Kanner MD, FACS Previous Signature: 06/18/2015 10:16:55 AM Version By: Evlyn Kanner MD, FACS Entered By: Evlyn Kanner on 06/18/2015 10:52:20 Frenz, [name] ([id number]) -------------------------------------------------------------------------------- SuperBill Details Patient Name: Ozer, Axyl C. Date of Service: 06/18/2015 Medical Record Number: [id number] Patient Account Number: [id number] Date of Birth/Sex: June 10, 1971 (44 y.o. Male) Treating RN: Curtis Sites Primary Care Physician: Mayford Knife, ERIC Other Clinician: Referring Physician: Mayford Knife, ERIC Treating Physician/Extender: Rudene Re in Treatment: 5 Diagnosis Coding ICD-10 Codes Code Description I87.313 Chronic venous hypertension (idiopathic) with ulcer of bilateral lower extremity L97.212 Non-pressure chronic ulcer of right calf with fat layer exposed L97.222 Non-pressure chronic ulcer of left calf with fat layer exposed E66.01 Morbid (severe) obesity due to excess calories I89.0 Lymphedema, not elsewhere classified Facility Procedures CPT4: Description Modifier Quantity Code [phone] (Facility Use Only) [phone] - APPLY MULTLAY COMPRS LWR RT 1 LEG Physician Procedures CPT4: Description Modifier Quantity Code [id number] - WC PHYS LEVEL 3 - EST PT 1 ICD-10 Description Diagnosis I87.313 Chronic venous hypertension (idiopathic) with ulcer of bilateral lower extremity L97.212 Non-pressure chronic ulcer of right calf  with fat layer exposed Electronic Signature(s) Signed: 06/18/2015 10:17:14 AM By: Evlyn Kanner MD, FACS Entered By: Evlyn Kanner on 06/18/2015 10:17:14

## 2015-06-28 ENCOUNTER — Encounter: Payer: No Typology Code available for payment source | Attending: Surgery | Admitting: Surgery

## 2015-06-28 DIAGNOSIS — L97212 Non-pressure chronic ulcer of right calf with fat layer exposed: Secondary | ICD-10-CM | POA: Insufficient documentation

## 2015-06-28 DIAGNOSIS — I890 Lymphedema, not elsewhere classified: Secondary | ICD-10-CM | POA: Diagnosis not present

## 2015-06-28 DIAGNOSIS — I87313 Chronic venous hypertension (idiopathic) with ulcer of bilateral lower extremity: Secondary | ICD-10-CM | POA: Insufficient documentation

## 2015-06-28 DIAGNOSIS — L97222 Non-pressure chronic ulcer of left calf with fat layer exposed: Secondary | ICD-10-CM | POA: Insufficient documentation

## 2015-06-28 NOTE — Progress Notes (Signed)
RAIYAN, DALESANDRO ([id number]) Visit Report for 06/28/2015 Arrival Information Details Patient Name: [name], [name]. Date of Service: 06/28/2015 8:00 AM Medical Record Number: [id number] Patient Account Number: [id number] Date of Birth/Sex: January 31, 1971 (44 y.o. Male) Treating RN: Clover Mealy, RN, BSN, [place] Sink Primary Care Physician: Mayford Knife, ERIC Other Clinician: Referring Physician: Mayford Knife, ERIC Treating Physician/Extender: Rudene Re in Treatment: 6 Visit Information History Since Last Visit Any new allergies or adverse reactions: No Patient Arrived: Ambulatory Had a fall or experienced change in No Arrival Time: 08:06 activities of daily living that may affect Accompanied By: wife risk of falls: Transfer Assistance: None Signs or symptoms of abuse/neglect since last No Patient Identification Verified: Yes visito Secondary Verification Process Yes Hospitalized since last visit: No Completed: Has Dressing in Place as Prescribed: Yes Patient Has Alerts: Yes Has Compression in Place as Prescribed: Yes Patient Alerts: Patient on Blood Pain Present Now: No Thinner Electronic Signature(s) Signed: 06/28/2015 8:06:40 AM By: Elpidio Eric BSN, RN Entered By: Elpidio Eric on 06/28/2015 08:06:40 Hanners, [name] ([id number]) -------------------------------------------------------------------------------- Encounter Discharge Information Details Patient Name: Oscar Mirza C. Date of Service: 06/28/2015 8:00 AM Medical Record Number: [id number] Patient Account Number: [id number] Date of Birth/Sex: 1971/08/19 (44 y.o. Male) Treating RN: Clover Mealy, RN, BSN, [place] Sink Primary Care Physician: Mayford Knife, ERIC Other Clinician: Referring Physician: Mayford Knife, ERIC Treating Physician/Extender: Rudene Re in Treatment: 6 Encounter Discharge Information Items Discharge Pain Level: 0 Discharge Condition: Stable Ambulatory Status: Ambulatory Discharge Destination: Home Transportation: Private  Auto Accompanied By: wife Schedule Follow-up Appointment: No Medication Reconciliation completed and provided to Patient/Care No Jaire Pinkham: Provided on Clinical Summary of Care: 06/28/2015 Form Type Recipient Paper Patient EM Electronic Signature(s) Signed: 06/28/2015 8:48:42 AM By: Gwenlyn Perking Previous Signature: 06/28/2015 8:48:21 AM Version By: Elpidio Eric BSN, RN Entered By: Gwenlyn Perking on 06/28/2015 08:48:42 Wingler, Ben Salena Saner ([id number]) -------------------------------------------------------------------------------- Lower Extremity Assessment Details Patient Name: Moncure, [name] Date of Service: 06/28/2015 8:00 AM Medical Record Number: [id number] Patient Account Number: [id number] Date of Birth/Sex: 06/18/1971 (44 y.o. Male) Treating RN: Clover Mealy, RN, BSN, [place] Sink Primary Care Physician: Mayford Knife, ERIC Other Clinician: Referring Physician: Mayford Knife, ERIC Treating Physician/Extender: Rudene Re in Treatment: 6 Edema Assessment Assessed: [Left: No] [Right: No] E[Left: dema] [Right: :] Calf Left: Right: Point of Measurement: 33 cm From Medial Instep cm 45.5 cm Ankle Left: Right: Point of Measurement: 11 cm From Medial Instep cm 28 cm Vascular Assessment Pulses: Posterior Tibial Dorsalis Pedis Palpable: [Right:Yes] Extremity colors, hair growth, and conditions: Extremity Color: [Right:Mottled] Hair Growth on Extremity: [Right:Yes] Temperature of Extremity: [Right:Warm] Capillary Refill: [Right:< 3 seconds] Dependent Rubor: [Right:No] Blanched when Elevated: [Right:No] Lipodermatosclerosis: [Right:No] Toe Nail Assessment Left: Right: Thick: No Discolored: No Deformed: No Improper Length and Hygiene: No Electronic Signature(s) Signed: 06/28/2015 8:12:46 AM By: Elpidio Eric BSN, RN Gillott, [name] ([id number]) Entered By: Elpidio Eric on 06/28/2015 08:12:46 Garside, [name] Kitchen  ([id number]) -------------------------------------------------------------------------------- Multi Wound Chart Details Patient Name: Antone, [name] Date of Service: 06/28/2015 8:00 AM Medical Record Number: [id number] Patient Account Number: [id number] Date of Birth/Sex: 09-29-1971 (44 y.o. Male) Treating RN: Clover Mealy, RN, BSN, [place] Sink Primary Care Physician: Mayford Knife, ERIC Other Clinician: Referring Physician: TURNER, ERIC Treating Physician/Extender: Rudene Re in Treatment: 6 Vital Signs Height(in): 72 Pulse(bpm): 78 Weight(lbs): 386 Blood Pressure 118/72 (mmHg): Body Mass Index(BMI): 52 Temperature(F): 98.2 Respiratory Rate 19 (breaths/min): Photos: [1:No Photos] [N/A:N/A] Wound Location: [1:Right Lower Leg - Lateral N/A] Wounding Event: [1:Gradually Appeared] [N/A:N/A] Primary Etiology: [1:Venous Leg Ulcer] [N/A:N/A] Comorbid History: [1:Sleep Apnea, Rheumatoid  N/A Arthritis] Date Acquired: [1:01/26/2015] [N/A:N/A] Weeks of Treatment: [1:6] [N/A:N/A] Wound Status: [1:Open] [N/A:N/A] Measurements L x W x D 1.5x1.5x0.1 [N/A:N/A] (cm) Area (cm) : [1:1.767] [N/A:N/A] Volume (cm) : [1:0.177] [N/A:N/A] % Reduction in Area: [1:82.10%] [N/A:N/A] % Reduction in Volume: 82.10% [N/A:N/A] Classification: [1:Full Thickness Without Exposed Support Structures] [N/A:N/A] Exudate Amount: [1:Medium] [N/A:N/A] Exudate Type: [1:Serous] [N/A:N/A] Exudate Color: [1:amber] [N/A:N/A] Wound Margin: [1:Indistinct, nonvisible] [N/A:N/A] Granulation Amount: [1:Large (67-100%)] [N/A:N/A] Granulation Quality: [1:Pink, Hyper-granulation N/A] Necrotic Amount: [1:None Present (0%)] [N/A:N/A] Exposed Structures: [1:Fascia: No Fat: No Tendon: No Muscle: No Joint: No] [N/A:N/A] Bone: No Limited to Skin Breakdown Epithelialization: Small (1-33%) N/A N/A Periwound Skin Texture: Edema: Yes N/A N/A Periwound Skin Moist: Yes N/A N/A Moisture: Maceration: No Periwound Skin Color: No  Abnormalities Noted N/A N/A Temperature: No Abnormality N/A N/A Tenderness on No N/A N/A Palpation: Wound Preparation: Ulcer Cleansing: N/A N/A Rinsed/Irrigated with Saline, Other: water and soap Treatment Notes Electronic Signature(s) Signed: 06/28/2015 8:33:54 AM By: Elpidio Eric BSN, RN Entered By: Elpidio Eric on 06/28/2015 08:33:53 Pedigo, [name] ([id number]) -------------------------------------------------------------------------------- Multi-Disciplinary Care Plan Details Patient Name: Oscar Mirza C. Date of Service: 06/28/2015 8:00 AM Medical Record Number: [id number] Patient Account Number: [id number] Date of Birth/Sex: June 25, 1971 (44 y.o. Male) Treating RN: Clover Mealy, RN, BSN, [place] Sink Primary Care Physician: Mayford Knife, ERIC Other Clinician: Referring Physician: Mayford Knife, ERIC Treating Physician/Extender: Rudene Re in Treatment: 6 Active Inactive Orientation to the Wound Care Program Nursing Diagnoses: Knowledge deficit related to the wound healing center program Goals: Patient/caregiver will verbalize understanding of the Wound Healing Center Program Date Initiated: 05/14/2015 Goal Status: Active Interventions: Provide education on orientation to the wound center Notes: Venous Leg Ulcer Nursing Diagnoses: Potential for venous Insuffiency (use before diagnosis confirmed) Goals: Non-invasive venous studies are completed as ordered Date Initiated: 05/14/2015 Goal Status: Active Interventions: Assess peripheral edema status every visit. Treatment Activities: Non-invasive vascular studies : 06/28/2015 Notes: Wound/Skin Impairment Nursing Diagnoses: Impaired tissue integrity Yinger, [name] ([id number]) Goals: Patient/caregiver will verbalize understanding of skin care regimen Date Initiated: 05/14/2015 Goal Status: Active Ulcer/skin breakdown will heal within 14 weeks Date Initiated: 05/14/2015 Goal Status: Active Interventions: Assess patient/caregiver  ability to obtain necessary supplies Notes: Electronic Signature(s) Signed: 06/28/2015 8:33:47 AM By: Elpidio Eric BSN, RN Entered By: Elpidio Eric on 06/28/2015 08:33:46 Borsuk, [name] ([id number]) -------------------------------------------------------------------------------- Pain Assessment Details Patient Name: Oscar Mirza C. Date of Service: 06/28/2015 8:00 AM Medical Record Number: [id number] Patient Account Number: [id number] Date of Birth/Sex: September 27, 1971 (44 y.o. Male) Treating RN: Clover Mealy, RN, BSN, [place] Sink Primary Care Physician: Mayford Knife, ERIC Other Clinician: Referring Physician: Mayford Knife, ERIC Treating Physician/Extender: Rudene Re in Treatment: 6 Active Problems Location of Pain Severity and Description of Pain Patient Has Paino No Site Locations Pain Management and Medication Current Pain Management: Electronic Signature(s) Signed: 06/28/2015 8:06:50 AM By: Elpidio Eric BSN, RN Entered By: Elpidio Eric on 06/28/2015 08:06:50 Hocutt, [name] ([id number]) -------------------------------------------------------------------------------- Patient/Caregiver Education Details Patient Name: Oscar Mirza C. Date of Service: 06/28/2015 8:00 AM Medical Record Number: [id number] Patient Account Number: [id number] Date of Birth/Gender: 1971-05-13 (44 y.o. Male) Treating RN: Clover Mealy, RN, BSN, [place] Sink Primary Care Physician: Mayford Knife, ERIC Other Clinician: Referring Physician: Mayford Knife, ERIC Treating Physician/Extender: Rudene Re in Treatment: 6 Education Assessment Education Provided To: Patient Education Topics Provided Welcome To The Wound Care Center: Methods: Explain/Verbal Responses: State content correctly Electronic Signature(s) Signed: 06/28/2015 8:48:31 AM By: Elpidio Eric BSN, RN Entered By: Elpidio Eric on 06/28/2015 08:48:31 Gohman, [name] ([id number]) -------------------------------------------------------------------------------- Wound Assessment  Details Patient Name: Wurster, [name] Date of Service: 06/28/2015 8:00 AM Medical Record Number: [id number] Patient Account Number: [id number] Date of Birth/Sex: 1971/05/27 (44 y.o. Male) Treating RN: Clover Mealy, RN, BSN, [name] Primary Care Physician: Mayford Knife, ERIC Other Clinician: Referring Physician: Mayford Knife, ERIC Treating Physician/Extender: Rudene Re in Treatment: 6 Wound Status Wound Number: 1 Primary Etiology: Venous Leg Ulcer Wound Location: Right Lower Leg - Lateral Wound Status: Open Wounding Event: Gradually Appeared Comorbid Sleep Apnea, Rheumatoid History: Arthritis Date Acquired: 01/26/2015 Weeks Of Treatment: 6 Clustered Wound: No Wound Measurements Length: (cm) 1.5 Width: (cm) 1.5 Depth: (cm) 0.1 Area: (cm) 1.767 Volume: (cm) 0.177 % Reduction in Area: 82.1% % Reduction in Volume: 82.1% Epithelialization: Small (1-33%) Tunneling: No Undermining: No Wound Description Full Thickness Without Exposed Classification: Support Structures Wound Margin: Indistinct, nonvisible Exudate Medium Amount: Exudate Type: Serous Exudate Color: amber Wound Bed Granulation Amount: Large (67-100%) Exposed Structure Granulation Quality: Pink, Hyper-granulation Fascia Exposed: No Necrotic Amount: None Present (0%) Fat Layer Exposed: No Tendon Exposed: No Muscle Exposed: No Joint Exposed: No Bone Exposed: No Limited to Skin Breakdown Periwound Skin Texture Texture Color No Abnormalities Noted: No No Abnormalities Noted: No Localized Edema: Yes Temperature / Pain Moisture Temperature: No Abnormality Gasparro, [name] ([id number]) No Abnormalities Noted: No Maceration: No Moist: Yes Wound Preparation Ulcer Cleansing: Rinsed/Irrigated with Saline, Other: water and soap, Treatment Notes Wound #1 (Right, Lateral Lower Leg) 1. Cleansed with: Cleanse wound with antibacterial soap and water 3. Peri-wound Care: Moisturizing lotion Skin Prep 5. Secondary  Dressing Applied Dry Gauze 7. Secured with [name] to Right Lower Extremity Notes drawtex over apligraf white unna Electronic Signature(s) Signed: 06/28/2015 8:19:53 AM By: Elpidio Eric BSN, RN Entered By: Elpidio Eric on 06/28/2015 08:19:53 Prichard, [name] ([id number]) -------------------------------------------------------------------------------- Vitals Details Patient Name: Oscar Mirza C. Date of Service: 06/28/2015 8:00 AM Medical Record Number: [id number] Patient Account Number: [id number] Date of Birth/Sex: 1971/05/10 (44 y.o. Male) Treating RN: Clover Mealy, RN, BSN, [name] Primary Care Physician: Mayford Knife, ERIC Other Clinician: Referring Physician: Mayford Knife, ERIC Treating Physician/Extender: Rudene Re in Treatment: 6 Vital Signs Time Taken: 08:08 Temperature (F): 98.2 Height (in): 72 Pulse (bpm): 78 Weight (lbs): 386 Respiratory Rate (breaths/min): 19 Body Mass Index (BMI): 52.3 Blood Pressure (mmHg): 118/72 Reference Range: 80 - 120 mg / dl Electronic Signature(s) Signed: 06/28/2015 8:09:15 AM By: Elpidio Eric BSN, RN Entered By: Elpidio Eric on 06/28/2015 08:09:15

## 2015-06-29 NOTE — Progress Notes (Addendum)
KRIST, ROSENBOOM ([id number]) Visit Report for 06/28/2015 Chief Complaint Document Details Patient Name: [name], [name]. Date of Service: 06/28/2015 8:00 AM Medical Record Number: [id number] Patient Account Number: [id number] Date of Birth/Sex: [birth year] (44 y.o. Male) Treating RN: Clover Mealy, RN, BSN, [place] Sink Primary Care Physician: Mayford Knife, ERIC Other Clinician: Referring Physician: Mayford Knife, ERIC Treating Physician/Extender: Rudene Re in Treatment: 6 Information Obtained from: Patient Chief Complaint Patient presents for treatment of an open ulcer due to venous insufficiency. 44 year old gentleman who has bilateral lower leg swelling and ulceration for the last 3 months. Electronic Signature(s) Signed: 06/28/2015 8:46:07 AM By: Evlyn Kanner MD, FACS Entered By: Evlyn Kanner on 06/28/2015 08:46:07 Seide, [name] ([id number]) -------------------------------------------------------------------------------- Cellular or Tissue Based Product Details Patient Name: Oscar Mirza C. Date of Service: 06/28/2015 8:00 AM Medical Record Number: [id number] Patient Account Number: [id number] Date of Birth/Sex: 31-Jul-1971 (44 y.o. Male) Treating RN: Clover Mealy, RN, BSN, [place] Sink Primary Care Physician: Mayford Knife, ERIC Other Clinician: Referring Physician: Mayford Knife, ERIC Treating Physician/Extender: Rudene Re in Treatment: 6 Cellular or Tissue Based Wound #1 Right,Lateral Lower Leg Product Type Applied to: Performed By: Physician Tristan Schroeder., MD Cellular or Tissue Based Apligraf Product Type: Time-Out Taken: Yes Location: trunk / arms / legs Wound Size (sq cm): 2.25 Product Size (sq cm): 44 Waste Size (sq cm): 41 Waste Reason: extra Amount of Product Applied (sq cm): 3 Lot #: [id number] Expiration Date: 07/04/2015 Fenestrated: Yes Instrument: Blade Reconstituted: Yes Solution Type: saline Solution Amount: 1ml Lot #: [id number] Solution Expiration 02/24/2017 Date: Secured:  Yes Secured With: Steri-Strips Dressing Applied: Yes Primary Dressing: mepitel Procedural Pain: 0 Post Procedural Pain: 0 Response to Treatment: Procedure was tolerated well Post Procedure Diagnosis Same as Pre-procedure Electronic Signature(s) Signed: 06/28/2015 8:49:29 AM By: Evlyn Kanner MD, FACS Previous Signature: 06/28/2015 8:47:24 AM Version By: Elpidio Eric BSN, RN Entered By: Evlyn Kanner on 06/28/2015 08:49:29 [name], [name] ([id number]) [name], [name] ([id number]) -------------------------------------------------------------------------------- HPI Details Patient Name: Banton, [name] Date of Service: 06/28/2015 8:00 AM Medical Record Number: [id number] Patient Account Number: [id number] Date of Birth/Sex: 26-Oct-1971 (44 y.o. Male) Treating RN: Clover Mealy, RN, BSN,  Sink Primary Care Physician: Mayford Knife, ERIC Other Clinician: Referring Physician: TURNER, ERIC Treating Physician/Extender: Rudene Re in Treatment: 6 History of Present Illness Location: bilateral lower extremity swelling and ulceration Quality: Patient reports experiencing a dull pain to affected area(s). Severity: Patient states wound are getting worse. Duration: Patient has had the wound for > 3 months prior to seeking treatment at the wound center Timing: Pain in wound is Intermittent (comes and goes Context: The wound appeared gradually over time Modifying Factors: Other treatment(s) tried include: has seen his PCP and has been on Keflex. Associated Signs and Symptoms: Patient reports having difficulty standing for long periods. HPI Description: This 44 year old gentleman has been referred from his PCPs office where he was seen by Esperanza Sheets, PA. The patient is known to be seen most recently for morbidly obese, sleep apnea, chronic lower extremity edema, osteoarthritis, pain in the legs. His past medical history is significant for chronic venous insufficiency, pain in the back, morbid  obesity, cardiomegaly, osteoarthritis of the hip, sleep apnea, kidney stones, hematuria, ulcers both lower extremities. He had a left hip arthroplasty in April of this year. He has never had venous duplex studies done on his lower extremities though he has been to the wound center several years ago. He has also not been wearing his compression stockings. 05/21/2015. He has been compliant with elevation of the  limb and wearing his sleep apnea machine so that he can breathe better and try and get to sleep in a bed. His vascular studies as scheduled for early August. 05/28/2015 -- he has his vascular appointment tomorrow afternoon and other than that has been doing very well. 06/04/2015 -- he had his vascular studies done last week and he goes back for them to be red and to get the opinion of the vascular surgeon. His insurance has not given him the Jobst stocking yet and he is otherwise doing fine. 06/11/2015 -- he did visit the vascular surgeons and though we don't have the official report the patient says that his study was complete and no surgical intervention was recommended the present time. He had compression stockings recommended for his venous hypertension and at some stage if he continued to have problems they would recommended lymphedema pumps. We will await official report regarding his vascular workup. In the meanwhile his wound is doing well and he has insurance approval for Apligraf and we will proceed with the first application of Apligraf today. 06/18/2015 -- Seen by Dr. Gilda Crease on 06/09/2015. Duplex ultrasound of the lower extremity showed normal deep venous system, superficial reflux was not present on the left and in a trivial segment on the right. Recommendations were for 20-30 mmHg compression stockings and at some stage later may be lymphedema pumps. 06/28/2015 -- he has done fine in this last week and is here for a second application of Apligraf. [name], [name]  ([id number]) Electronic Signature(s) Signed: 06/28/2015 8:46:42 AM By: Evlyn Kanner MD, FACS Entered By: Evlyn Kanner on 06/28/2015 08:46:42 [name] ([id number]) -------------------------------------------------------------------------------- Physical Exam Details Patient Name: Behrendt, Manville C. Date of Service: 06/28/2015 8:00 AM Medical Record Number: [id number] Patient Account Number: [id number] Date of Birth/Sex: [birth year] (44 y.o. Male) Treating RN: Clover Mealy, RN, BSN, [place] Sink Primary Care Physician: Mayford Knife, ERIC Other Clinician: Referring Physician: Mayford Knife, ERIC Treating Physician/Extender: Rudene Re in Treatment: 6 Constitutional . Pulse regular. Respirations normal and unlabored. Afebrile. . Eyes Nonicteric. Reactive to light. Ears, Nose, Mouth, and Throat Lips, teeth, and gums WNL.Marland Kitchen Moist mucosa without lesions . Neck supple and nontender. No palpable supraclavicular or cervical adenopathy. Normal sized without goiter. Respiratory WNL. No retractions.. Cardiovascular Pedal Pulses WNL. No clubbing, cyanosis or edema. Lymphatic No adneopathy. No adenopathy. No adenopathy. Musculoskeletal Adexa without tenderness or enlargement.. Digits and nails w/o clubbing, cyanosis, infection, petechiae, ischemia, or inflammatory conditions.. Integumentary (Hair, Skin) No suspicious lesions. No crepitus or fluctuance. No peri-wound warmth or erythema. No masses.Marland Kitchen Psychiatric Judgement and insight Intact.. No evidence of depression, anxiety, or agitation.. Notes the wound in the right lower extremity shows good epithelialization and is ready for its Application of Apligraf with the usual precautions. Electronic Signature(s) Signed: 06/28/2015 8:47:10 AM By: Evlyn Kanner MD, FACS Entered By: Evlyn Kanner on 06/28/2015 08:47:10 [name], [name] ([id number]) -------------------------------------------------------------------------------- Physician Orders  Details Patient Name: Oscar Mirza C. Date of Service: 06/28/2015 8:00 AM Medical Record Number: [id number] Patient Account Number: [id number] Date of Birth/Sex: Nov 29, 1970 (44 y.o. Male) Treating RN: Clover Mealy, RN, BSN, [place] Sink Primary Care Physician: Mayford Knife, ERIC Other Clinician: Referring Physician: Mayford Knife, ERIC Treating Physician/Extender: Rudene Re in Treatment: 6 Verbal / Phone Orders: Yes Clinician: Afful, RN, BSN, Rita Read Back and Verified: Yes Diagnosis Coding Wound Cleansing Wound #1 Right,Lateral Lower Leg o Cleanse wound with mild soap and water Primary Wound Dressing o Drawtex Dressing Change Frequency Wound #1 Right,Lateral Lower Leg o Change dressing every week Follow-up Appointments  Wound #1 Right,Lateral Lower Leg o Return Appointment in 1 week. Edema Control o Unna Boot to Right Lower Extremity Advanced Therapies Wound #1 Right,Lateral Lower Leg o Apligraf application in clinic; including contact layer, fixation with steri strips, dry gauze and cover dressing. Electronic Signature(s) Signed: 06/28/2015 8:34:23 AM By: Elpidio Eric BSN, RN Signed: 06/28/2015 4:57:18 PM By: Evlyn Kanner MD, FACS Entered By: Elpidio Eric on 06/28/2015 08:34:23 [name], [name] ([id number]) -------------------------------------------------------------------------------- Problem List Details Patient Name: [name], [name] Date of Service: 06/28/2015 8:00 AM Medical Record Number: [id number] Patient Account Number: [id number] Date of Birth/Sex: 01-Dec-1970 (44 y.o. Male) Treating RN: Clover Mealy, RN, BSN, [place] Sink Primary Care Physician: Mayford Knife, ERIC Other Clinician: Referring Physician: Mayford Knife, ERIC Treating Physician/Extender: Rudene Re in Treatment: 6 Active Problems ICD-10 Encounter Code Description Active Date Diagnosis I87.313 Chronic venous hypertension (idiopathic) with ulcer of 05/14/2015 Yes bilateral lower extremity L97.212 Non-pressure chronic  ulcer of right calf with fat layer 05/14/2015 Yes exposed L97.222 Non-pressure chronic ulcer of left calf with fat layer 05/14/2015 Yes exposed E66.01 Morbid (severe) obesity due to excess calories 05/14/2015 Yes I89.0 Lymphedema, not elsewhere classified 05/14/2015 Yes Inactive Problems Resolved Problems Electronic Signature(s) Signed: 06/28/2015 8:46:00 AM By: Evlyn Kanner MD, FACS Entered By: Evlyn Kanner on 06/28/2015 08:46:00 Gutter, [name] ([id number]) -------------------------------------------------------------------------------- Progress Note Details Patient Name: [name], [name] Date of Service: 06/28/2015 8:00 AM Medical Record Number: [id number] Patient Account Number: [id number] Date of Birth/Sex: 1971/01/05 (44 y.o. Male) Treating RN: Clover Mealy, RN, BSN, [place] Sink Primary Care Physician: Mayford Knife, ERIC Other Clinician: Referring Physician: Mayford Knife, ERIC Treating Physician/Extender: Rudene Re in Treatment: 6 Subjective Chief Complaint Information obtained from Patient Patient presents for treatment of an open ulcer due to venous insufficiency. 44 year old gentleman who has bilateral lower leg swelling and ulceration for the last 3 months. History of Present Illness (HPI) The following HPI elements were documented for the patient's wound: Location: bilateral lower extremity swelling and ulceration Quality: Patient reports experiencing a dull pain to affected area(s). Severity: Patient states wound are getting worse. Duration: Patient has had the wound for > 3 months prior to seeking treatment at the wound center Timing: Pain in wound is Intermittent (comes and goes Context: The wound appeared gradually over time Modifying Factors: Other treatment(s) tried include: has seen his PCP and has been on Keflex. Associated Signs and Symptoms: Patient reports having difficulty standing for long periods. This 44 year old gentleman has been referred from his PCPs office where he  was seen by Esperanza Sheets, PA. The patient is known to be seen most recently for morbidly obese, sleep apnea, chronic lower extremity edema, osteoarthritis, pain in the legs. His past medical history is significant for chronic venous insufficiency, pain in the back, morbid obesity, cardiomegaly, osteoarthritis of the hip, sleep apnea, kidney stones, hematuria, ulcers both lower extremities. He had a left hip arthroplasty in April of this year. He has never had venous duplex studies done on his lower extremities though he has been to the wound center several years ago. He has also not been wearing his compression stockings. 05/21/2015. He has been compliant with elevation of the limb and wearing his sleep apnea machine so that he can breathe better and try and get to sleep in a bed. His vascular studies as scheduled for early August. 05/28/2015 -- he has his vascular appointment tomorrow afternoon and other than that has been doing very well. 06/04/2015 -- he had his vascular studies done last week and he goes back for them to  be red and to get the opinion of the vascular surgeon. His insurance has not given him the Jobst stocking yet and he is otherwise doing fine. 06/11/2015 -- he did visit the vascular surgeons and though we don't have the official report the patient says that his study was complete and no surgical intervention was recommended the present time. He had compression stockings recommended for his venous hypertension and at some stage if he continued to have problems they would recommended lymphedema pumps. We will await official report regarding his vascular workup. [name], [name] ([id number]) In the meanwhile his wound is doing well and he has insurance approval for Apligraf and we will proceed with the first application of Apligraf today. 06/18/2015 -- Seen by Dr. Gilda Crease on 06/09/2015. Duplex ultrasound of the lower extremity showed normal deep venous system, superficial  reflux was not present on the left and in a trivial segment on the right. Recommendations were for 20-30 mmHg compression stockings and at some stage later may be lymphedema pumps. 06/28/2015 -- he has done fine in this last week and is here for a second application of Apligraf. Objective Constitutional Pulse regular. Respirations normal and unlabored. Afebrile. Vitals Time Taken: 8:08 AM, Height: 72 in, Weight: 386 lbs, BMI: 52.3, Temperature: 98.2 F, Pulse: 78 bpm, Respiratory Rate: 19 breaths/min, Blood Pressure: 118/72 mmHg. Eyes Nonicteric. Reactive to light. Ears, Nose, Mouth, and Throat Lips, teeth, and gums WNL.Marland Kitchen Moist mucosa without lesions . Neck supple and nontender. No palpable supraclavicular or cervical adenopathy. Normal sized without goiter. Respiratory WNL. No retractions.. Cardiovascular Pedal Pulses WNL. No clubbing, cyanosis or edema. Lymphatic No adneopathy. No adenopathy. No adenopathy. Musculoskeletal Adexa without tenderness or enlargement.. Digits and nails w/o clubbing, cyanosis, infection, petechiae, ischemia, or inflammatory conditions.Marland Kitchen Psychiatric Judgement and insight Intact.. No evidence of depression, anxiety, or agitation.Marland Kitchen [name], [name] ([id number]) General Notes: the wound in the right lower extremity shows good epithelialization and is ready for its Application of Apligraf with the usual precautions. Integumentary (Hair, Skin) No suspicious lesions. No crepitus or fluctuance. No peri-wound warmth or erythema. No masses.. Wound #1 status is Open. Original cause of wound was Gradually Appeared. The wound is located on the Right,Lateral Lower Leg. The wound measures 1.5cm length x 1.5cm width x 0.1cm depth; 1.767cm^2 area and 0.177cm^3 volume. The wound is limited to skin breakdown. There is no tunneling or undermining noted. There is a medium amount of serous drainage noted. The wound margin is indistinct and nonvisible. There is large  (67-100%) pink granulation within the wound bed. There is no necrotic tissue within the wound bed. The periwound skin appearance exhibited: Localized Edema, Moist. The periwound skin appearance did not exhibit: Maceration. Periwound temperature was noted as No Abnormality. Assessment Active Problems ICD-10 I87.313 - Chronic venous hypertension (idiopathic) with ulcer of bilateral lower extremity L97.212 - Non-pressure chronic ulcer of right calf with fat layer exposed L97.222 - Non-pressure chronic ulcer of left calf with fat layer exposed E66.01 - Morbid (severe) obesity due to excess calories I89.0 - Lymphedema, not elsewhere classified With usual precautions being followed the second layer of Apligraf was applied to the wound. A bolster was applied over this and an Unna's boot was applied. He will come next week for a wound check. Due to a scheduled hip surgery in the third week off September, I will defer application of the third layer of Apligraf to when he comes for his visit in 2 weeks. Procedures Wound #1 Wound #1 is a Venous  Leg Ulcer located on the Right,Lateral Lower Leg. A skin graft procedure using a bioengineered skin substitute/cellular or tissue based product was performed by Marri Mcneff, Ignacia Felling., MD. Apligraf was applied and secured with Steri-Strips. 3 sq cm of product was utilized and 41 sq cm was wasted due to extra. Post Application, mepitel was applied. A Time Out was conducted prior to the start of the procedure. The procedure was tolerated well with a pain level of 0 throughout and a pain level of 0 following the Hohler, Sreekar C. ([id number]) procedure. Post procedure Diagnosis Wound #1: Same as Pre-Procedure . Plan Wound Cleansing: Wound #1 Right,Lateral Lower Leg: Cleanse wound with mild soap and water Primary Wound Dressing: Drawtex Dressing Change Frequency: Wound #1 Right,Lateral Lower Leg: Change dressing every week Follow-up Appointments: Wound #1  Right,Lateral Lower Leg: Return Appointment in 1 week. Edema Control: Unna Boot to Right Lower Extremity Advanced Therapies: Wound #1 Right,Lateral Lower Leg: Apligraf application in clinic; including contact layer, fixation with steri strips, dry gauze and cover dressing. With usual precautions being followed the second layer of Apligraf was applied to the wound. A bolster was applied over this and an Unna's boot was applied. He will come next week for a wound check. Due to a scheduled hip surgery in the third week off September, I will defer application of the third layer of Apligraf to when he comes for his visit in 2 weeks. Electronic Signature(s) Signed: 06/29/2015 4:20:56 PM By: Evlyn Kanner MD, FACS Previous Signature: 06/28/2015 8:49:08 AM Version By: Evlyn Kanner MD, FACS Entered By: Evlyn Kanner on 06/29/2015 16:20:56 [name], [name] ([id number]) -------------------------------------------------------------------------------- SuperBill Details Patient Name: [name], Ravis C. Date of Service: 06/28/2015 Medical Record Number: [id number] Patient Account Number: [id number] Date of Birth/Sex: Oct 30, 1970 (44 y.o. Male) Treating RN: Clover Mealy, RN, BSN, Rita Primary Care Physician: Mayford Knife, ERIC Other Clinician: Referring Physician: Mayford Knife, ERIC Treating Physician/Extender: Rudene Re in Treatment: 6 Diagnosis Coding ICD-10 Codes Code Description I87.313 Chronic venous hypertension (idiopathic) with ulcer of bilateral lower extremity L97.212 Non-pressure chronic ulcer of right calf with fat layer exposed L97.222 Non-pressure chronic ulcer of left calf with fat layer exposed E66.01 Morbid (severe) obesity due to excess calories I89.0 Lymphedema, not elsewhere classified Facility Procedures CPT4: Description Modifier Quantity Code [id number] (Facility Use Only) Apligraf 1 SQ CM 44 CPT4: 16384665 15271 - SKIN SUB GRAFT TRNK/ARM/LEG 1 ICD-10 Description Diagnosis I87.313  Chronic venous hypertension (idiopathic) with ulcer of bilateral lower extremity L97.212 Non-pressure chronic ulcer of right calf with fat layer exposed L97.222  Non-pressure chronic ulcer of left calf with fat layer exposed I89.0 Lymphedema, not elsewhere classified Physician Procedures CPT4: Description Modifier Quantity Code [id number] 15271 - WC PHYS SKIN SUB GRAFT TRNK/ARM/LEG 1 ICD-10 Description Diagnosis I87.313 Chronic venous hypertension (idiopathic) with ulcer of bilateral lower extremity L97.212 Non-pressure chronic ulcer of  right calf with fat layer exposed L97.222 Non-pressure chronic ulcer of left calf with fat layer exposed I89.0 Lymphedema, not elsewhere classified Killgore, Parris C. ([id number]) Electronic Signature(s) Signed: 06/28/2015 8:49:45 AM By: Evlyn Kanner MD, FACS Entered By: Evlyn Kanner on 06/28/2015 08:49:45

## 2015-07-05 ENCOUNTER — Encounter: Payer: No Typology Code available for payment source | Admitting: Surgery

## 2015-07-05 DIAGNOSIS — L97212 Non-pressure chronic ulcer of right calf with fat layer exposed: Secondary | ICD-10-CM | POA: Diagnosis not present

## 2015-07-06 NOTE — Progress Notes (Signed)
GURNOOR, URSUA ([id number]) Visit Report for 07/05/2015 Arrival Information Details Patient Name: YAMIR, CARIGNAN. Date of Service: 07/05/2015 9:30 AM Medical Record Number: [id number] Patient Account Number: [id number] Date of Birth/Sex: 1971/10/21 (44 y.o. Male) Treating RN: Clover Mealy, RN, BSN, [place] Sink Primary Care Physician: Mayford Knife, ERIC Other Clinician: Referring Physician: Mayford Knife, ERIC Treating Physician/Extender: Rudene Re in Treatment: 7 Visit Information History Since Last Visit Added or deleted any medications: No Patient Arrived: Ambulatory Any new allergies or adverse reactions: No Arrival Time: 09:33 Had a fall or experienced change in No Accompanied By: wife activities of daily living that may affect Transfer Assistance: None risk of falls: Patient Identification Verified: Yes Signs or symptoms of abuse/neglect since last No Secondary Verification Process Yes visito Completed: Has Dressing in Place as Prescribed: Yes Patient Has Alerts: Yes Has Compression in Place as Prescribed: Yes Patient Alerts: Patient on Blood Pain Present Now: No Thinner Electronic Signature(s) Signed: 07/05/2015 9:33:53 AM By: Elpidio Eric BSN, RN Entered By: Elpidio Eric on 07/05/2015 09:33:52 Gertsch, [name] ([id number]) -------------------------------------------------------------------------------- Encounter Discharge Information Details Patient Name: [name] Date of Service: 07/05/2015 9:30 AM Medical Record Number: [id number] Patient Account Number: [id number] Date of Birth/Sex: [birth year] (44 y.o. Male) Treating RN: Clover Mealy, RN, BSN, [place] Sink Primary Care Physician: Mayford Knife, ERIC Other Clinician: Referring Physician: Mayford Knife, ERIC Treating Physician/Extender: Rudene Re in Treatment: 7 Encounter Discharge Information Items Discharge Pain Level: 0 Discharge Condition: Stable Ambulatory Status: Ambulatory Discharge Destination: Home Transportation: Private  Auto Accompanied By: wife Schedule Follow-up Appointment: No Medication Reconciliation completed and provided to Patient/Care No Dorthula Bier: Provided on Clinical Summary of Care: 07/05/2015 Form Type Recipient Paper Patient EM Electronic Signature(s) Signed: 07/05/2015 9:55:29 AM By: Gwenlyn Perking Previous Signature: 07/05/2015 9:54:52 AM Version By: Elpidio Eric BSN, RN Entered By: Gwenlyn Perking on 07/05/2015 09:55:29 Vitiello, Geordie Salena Saner ([id number]) -------------------------------------------------------------------------------- Lower Extremity Assessment Details Patient Name: Metts, Brentin C. Date of Service: 07/05/2015 9:30 AM Medical Record Number: [id number] Patient Account Number: [id number] Date of Birth/Sex: 1971-09-26 (44 y.o. Male) Treating RN: Clover Mealy, RN, BSN, [place] Sink Primary Care Physician: Mayford Knife, ERIC Other Clinician: Referring Physician: Mayford Knife, ERIC Treating Physician/Extender: Rudene Re in Treatment: 7 Edema Assessment Assessed: [Left: No] [Right: No] E[Left: dema] [Right: :] Calf Left: Right: Point of Measurement: 33 cm From Medial Instep cm 45.5 cm Ankle Left: Right: Point of Measurement: 11 cm From Medial Instep cm 28.2 cm Vascular Assessment Pulses: Posterior Tibial Dorsalis Pedis Palpable: [Right:Yes] Extremity colors, hair growth, and conditions: Extremity Color: [Right:Mottled] Hair Growth on Extremity: [Right:Yes] Temperature of Extremity: [Right:Warm] Capillary Refill: [Right:< 3 seconds] Toe Nail Assessment Left: Right: Thick: Yes Discolored: Yes Deformed: No Improper Length and Hygiene: Yes Electronic Signature(s) Signed: 07/05/2015 9:42:19 AM By: Elpidio Eric BSN, RN Entered By: Elpidio Eric on 07/05/2015 09:42:19 Tapscott, Billyjack CMarland Kitchen ([id number]) -------------------------------------------------------------------------------- Multi Wound Chart Details Patient Name: Platts, Stryder C. Date of Service: 07/05/2015 9:30 AM Medical Record  Number: [id number] Patient Account Number: [id number] Date of Birth/Sex: [birth year] (44 y.o. Male) Treating RN: Clover Mealy, RN, BSN, [place] Sink Primary Care Physician: Mayford Knife, ERIC Other Clinician: Referring Physician: TURNER, ERIC Treating Physician/Extender: Rudene Re in Treatment: 7 Vital Signs Height(in): 72 Pulse(bpm): 79 Weight(lbs): 386 Blood Pressure 136/71 (mmHg): Body Mass Index(BMI): 52 Temperature(F): 98.6 Respiratory Rate 19 (breaths/min): Photos: [1:No Photos] [N/A:N/A] Wound Location: [1:Right Lower Leg - Lateral N/A] Wounding Event: [1:Gradually Appeared] [N/A:N/A] Primary Etiology: [1:Venous Leg Ulcer] [N/A:N/A] Comorbid History: [1:Sleep Apnea, Rheumatoid N/A Arthritis] Date Acquired: [1:01/26/2015] [N/A:N/A] Weeks of Treatment: [1:7] [N/A:N/A] Wound  Status: [1:Open] [N/A:N/A] Measurements L x W x D 1.5x1.5x0.1 [N/A:N/A] (cm) Area (cm) : [1:1.767] [N/A:N/A] Volume (cm) : [1:0.177] [N/A:N/A] % Reduction in Area: [1:82.10%] [N/A:N/A] % Reduction in Volume: 82.10% [N/A:N/A] Classification: [1:Full Thickness Without Exposed Support Structures] [N/A:N/A] Exudate Amount: [1:Medium] [N/A:N/A] Exudate Type: [1:Serous] [N/A:N/A] Exudate Color: [1:amber] [N/A:N/A] Wound Margin: [1:Indistinct, nonvisible] [N/A:N/A] Granulation Amount: [1:Large (67-100%)] [N/A:N/A] Granulation Quality: [1:Pink, Hyper-granulation N/A] Necrotic Amount: [1:None Present (0%)] [N/A:N/A] Exposed Structures: [1:Fascia: No Fat: No Tendon: No Muscle: No Joint: No] [N/A:N/A] Bone: No Limited to Skin Breakdown Epithelialization: Small (1-33%) N/A N/A Periwound Skin Texture: Edema: Yes N/A N/A Periwound Skin Moist: Yes N/A N/A Moisture: Maceration: No Periwound Skin Color: No Abnormalities Noted N/A N/A Temperature: No Abnormality N/A N/A Tenderness on No N/A N/A Palpation: Wound Preparation: Ulcer Cleansing: N/A N/A Rinsed/Irrigated with Saline, Other: water and soap Topical  Anesthetic Applied: None Assessment Notes: Apligraf in place N/A N/A Treatment Notes Electronic Signature(s) Signed: 07/05/2015 9:43:24 AM By: Elpidio Eric BSN, RN Entered By: Elpidio Eric on 07/05/2015 09:43:24 Puskas, [name] ([id number]) -------------------------------------------------------------------------------- Multi-Disciplinary Care Plan Details Patient Name: [name] Date of Service: 07/05/2015 9:30 AM Medical Record Number: [id number] Patient Account Number: [id number] Date of Birth/Sex: January 10, 1971 (44 y.o. Male) Treating RN: Clover Mealy, RN, BSN, [place] Sink Primary Care Physician: Mayford Knife, ERIC Other Clinician: Referring Physician: Mayford Knife, ERIC Treating Physician/Extender: Rudene Re in Treatment: 7 Active Inactive Orientation to the Wound Care Program Nursing Diagnoses: Knowledge deficit related to the wound healing center program Goals: Patient/caregiver will verbalize understanding of the Wound Healing Center Program Date Initiated: 05/14/2015 Goal Status: Active Interventions: Provide education on orientation to the wound center Notes: Venous Leg Ulcer Nursing Diagnoses: Potential for venous Insuffiency (use before diagnosis confirmed) Goals: Non-invasive venous studies are completed as ordered Date Initiated: 05/14/2015 Goal Status: Active Interventions: Assess peripheral edema status every visit. Treatment Activities: Non-invasive vascular studies : 07/05/2015 Notes: Wound/Skin Impairment Nursing Diagnoses: Impaired tissue integrity Lapier, Finian C. ([id number]) Goals: Patient/caregiver will verbalize understanding of skin care regimen Date Initiated: 05/14/2015 Goal Status: Active Ulcer/skin breakdown will heal within 14 weeks Date Initiated: 05/14/2015 Goal Status: Active Interventions: Assess patient/caregiver ability to obtain necessary supplies Notes: Electronic Signature(s) Signed: 07/05/2015 9:43:17 AM By: Elpidio Eric BSN, RN Entered  By: Elpidio Eric on 07/05/2015 09:43:17 Faley, Enzio C. ([id number]) -------------------------------------------------------------------------------- Pain Assessment Details Patient Name: [name] Date of Service: 07/05/2015 9:30 AM Medical Record Number: [id number] Patient Account Number: [id number] Date of Birth/Sex: 07-Mar-1971 (44 y.o. Male) Treating RN: Clover Mealy, RN, BSN, [place] Sink Primary Care Physician: Mayford Knife, ERIC Other Clinician: Referring Physician: Mayford Knife, ERIC Treating Physician/Extender: Rudene Re in Treatment: 7 Active Problems Location of Pain Severity and Description of Pain Patient Has Paino No Site Locations Pain Management and Medication Current Pain Management: Electronic Signature(s) Signed: 07/05/2015 9:34:18 AM By: Elpidio Eric BSN, RN Entered By: Elpidio Eric on 07/05/2015 09:34:18 Crepeau, [name] ([id number]) -------------------------------------------------------------------------------- Patient/Caregiver Education Details Patient Name: [name] Date of Service: 07/05/2015 9:30 AM Medical Record Number: [id number] Patient Account Number: [id number] Date of Birth/Gender: [birth year] (44 y.o. Male) Treating RN: Clover Mealy, RN, BSN, [place] Sink Primary Care Physician: Mayford Knife, ERIC Other Clinician: Referring Physician: Mayford Knife, ERIC Treating Physician/Extender: Rudene Re in Treatment: 7 Education Assessment Education Provided To: Patient Education Topics Provided Basic Hygiene: Methods: Explain/Verbal Responses: State content correctly Welcome To The Wound Care Center: Methods: Explain/Verbal Responses: State content correctly Electronic Signature(s) Signed: 07/05/2015 9:55:13 AM By: Elpidio Eric BSN, RN Entered By: Elpidio Eric on 07/05/2015 09:55:13  Josephs, Weylin C. ([id number]) -------------------------------------------------------------------------------- Wound Assessment Details Patient Name: Berggren, Jj C. Date of  Service: 07/05/2015 9:30 AM Medical Record Number: [id number] Patient Account Number: [id number] Date of Birth/Sex: [birth year] (44 y.o. Male) Treating RN: Clover Mealy, RN, BSN, [place] Primary Care Physician: Mayford Knife, ERIC Other Clinician: Referring Physician: Mayford Knife, ERIC Treating Physician/Extender: Rudene Re in Treatment: 7 Wound Status Wound Number: 1 Primary Etiology: Venous Leg Ulcer Wound Location: Right Lower Leg - Lateral Wound Status: Open Wounding Event: Gradually Appeared Comorbid Sleep Apnea, Rheumatoid History: Arthritis Date Acquired: 01/26/2015 Weeks Of Treatment: 7 Clustered Wound: No Photos Photo Uploaded By: Elpidio Eric on 07/05/2015 16:36:31 Wound Measurements Length: (cm) 1.5 Width: (cm) 1.5 Depth: (cm) 0.1 Area: (cm) 1.767 Volume: (cm) 0.177 % Reduction in Area: 82.1% % Reduction in Volume: 82.1% Epithelialization: Small (1-33%) Tunneling: No Undermining: No Wound Description Full Thickness Without Exposed Foul Odor Aft Classification: Support Structures Wound Margin: Indistinct, nonvisible Exudate Medium Amount: Exudate Type: Serous Exudate Color: amber er Cleansing: No Wound Bed Granulation Amount: Large (67-100%) Exposed Structure Granulation Quality: Pink, Hyper-granulation Fascia Exposed: No Necrotic Amount: None Present (0%) Fat Layer Exposed: No Eno, Onis C. ([id number]) Tendon Exposed: No Muscle Exposed: No Joint Exposed: No Bone Exposed: No Limited to Skin Breakdown Periwound Skin Texture Texture Color No Abnormalities Noted: No No Abnormalities Noted: No Localized Edema: Yes Temperature / Pain Moisture Temperature: No Abnormality No Abnormalities Noted: No Maceration: No Moist: Yes Wound Preparation Ulcer Cleansing: Rinsed/Irrigated with Saline, Other: water and soap, Topical Anesthetic Applied: None Assessment Notes Apligraf in place Treatment Notes Wound #1 (Right, Lateral Lower Leg) 1. Cleansed  with: Cleanse wound with antibacterial soap and water 5. Secondary Dressing Applied Dry Gauze 7. Secured with Henriette Combs to Right Lower Extremity Notes drawtex over apligraf white unna Electronic Signature(s) Signed: 07/05/2015 9:42:58 AM By: Elpidio Eric BSN, RN Entered By: Elpidio Eric on 07/05/2015 09:42:58 Kaus, [name] ([id number]) -------------------------------------------------------------------------------- Vitals Details Patient Name: [name] Date of Service: 07/05/2015 9:30 AM Medical Record Number: [id number] Patient Account Number: [id number] Date of Birth/Sex: 08-25-1971 (44 y.o. Male) Treating RN: Clover Mealy, RN, BSN, [place] Primary Care Physician: Mayford Knife, ERIC Other Clinician: Referring Physician: Mayford Knife, ERIC Treating Physician/Extender: Rudene Re in Treatment: 7 Vital Signs Time Taken: 09:34 Temperature (F): 98.6 Height (in): 72 Pulse (bpm): 79 Weight (lbs): 386 Respiratory Rate (breaths/min): 19 Body Mass Index (BMI): 52.3 Blood Pressure (mmHg): 136/71 Reference Range: 80 - 120 mg / dl Electronic Signature(s) Signed: 07/05/2015 9:36:17 AM By: Elpidio Eric BSN, RN Entered By: Elpidio Eric on 07/05/2015 09:36:16

## 2015-07-06 NOTE — Progress Notes (Signed)
Oscar, [name] ([id number]) Visit Report for 07/05/2015 Chief Complaint Document Details Patient Name: [name], [name]. Date of Service: 07/05/2015 9:30 AM Medical Record Number: [id number] Patient Account Number: [id number] Date of Birth/Sex: 07-Sep-1971 (44 y.o. Male) Treating RN: Huel Coventry Primary Care Physician: Mayford Knife, ERIC Other Clinician: Referring Physician: Mayford Knife, ERIC Treating Physician/Extender: Rudene Re in Treatment: 7 Information Obtained from: Patient Chief Complaint Patient presents for treatment of an open ulcer due to venous insufficiency. 44 year old gentleman who has bilateral lower leg swelling and ulceration for the last 3 months. Electronic Signature(s) Signed: 07/05/2015 9:50:17 AM By: Evlyn Kanner MD, FACS Entered By: Evlyn Kanner on 07/05/2015 09:50:17 [place], [name] ([id number]) -------------------------------------------------------------------------------- HPI Details Patient Name: Oscar Mirza C. Date of Service: 07/05/2015 9:30 AM Medical Record Number: [id number] Patient Account Number: [id number] Date of Birth/Sex: August 06, 1971 (44 y.o. Male) Treating RN: Huel Coventry Primary Care Physician: Mayford Knife, ERIC Other Clinician: Referring Physician: Mayford Knife, ERIC Treating Physician/Extender: Rudene Re in Treatment: 7 History of Present Illness Location: bilateral lower extremity swelling and ulceration Quality: Patient reports experiencing a dull pain to affected area(s). Severity: Patient states wound are getting worse. Duration: Patient has had the wound for > 3 months prior to seeking treatment at the wound center Timing: Pain in wound is Intermittent (comes and goes Context: The wound appeared gradually over time Modifying Factors: Other treatment(s) tried include: has seen his PCP and has been on Keflex. Associated Signs and Symptoms: Patient reports having difficulty standing for long periods. HPI Description: This  44 year old gentleman has been referred from his PCPs office where he was seen by Esperanza Sheets, PA. The patient is known to be seen most recently for morbidly obese, sleep apnea, chronic lower extremity edema, osteoarthritis, pain in the legs. His past medical history is significant for chronic venous insufficiency, pain in the back, morbid obesity, cardiomegaly, osteoarthritis of the hip, sleep apnea, kidney stones, hematuria, ulcers both lower extremities. He had a left hip arthroplasty in April of this year. He has never had venous duplex studies done on his lower extremities though he has been to the wound center several years ago. He has also not been wearing his compression stockings. 05/21/2015. He has been compliant with elevation of the limb and wearing his sleep apnea machine so that he can breathe better and try and get to sleep in a bed. His vascular studies as scheduled for early August. 05/28/2015 -- he has his vascular appointment tomorrow afternoon and other than that has been doing very well. 06/04/2015 -- he had his vascular studies done last week and he goes back for them to be red and to get the opinion of the vascular surgeon. His insurance has not given him the Jobst stocking yet and he is otherwise doing fine. 06/11/2015 -- he did visit the vascular surgeons and though we don't have the official report the patient says that his study was complete and no surgical intervention was recommended the present time. He had compression stockings recommended for his venous hypertension and at some stage if he continued to have problems they would recommended lymphedema pumps. We will await official report regarding his vascular workup. In the meanwhile his wound is doing well and he has insurance approval for Apligraf and we will proceed with the first application of Apligraf today. 06/18/2015 -- Seen by Dr. Gilda Crease on 06/09/2015. Duplex ultrasound of the lower extremity showed  normal deep venous system, superficial reflux was not present on the left and in a trivial segment  on the right. Recommendations were for 20-30 mmHg compression stockings and at some stage later may be lymphedema pumps. 06/28/2015 -- he has done fine in this last week and is here for a second application of Apligraf. 07/05/2015 -- he is here for a wound check today and is anticipating he will be here next week prior to his RYLEN, SWINDLER ([id number]) right hip surgery on September 19 Electronic Signature(s) Signed: 07/05/2015 9:50:47 AM By: Evlyn Kanner MD, FACS Entered By: Evlyn Kanner on 07/05/2015 09:50:47 [place], [name] ([id number]) -------------------------------------------------------------------------------- Physical Exam Details Patient Name: Oscar, [name] Date of Service: 07/05/2015 9:30 AM Medical Record Number: [id number] Patient Account Number: [id number] Date of Birth/Sex: October 14, 1971 (44 y.o. Male) Treating RN: Huel Coventry Primary Care Physician: Mayford Knife, ERIC Other Clinician: Referring Physician: Mayford Knife, ERIC Treating Physician/Extender: Rudene Re in Treatment: 7 Constitutional . Pulse regular. Respirations normal and unlabored. Afebrile. . Eyes Nonicteric. Reactive to light. Ears, Nose, Mouth, and Throat Lips, teeth, and gums WNL.Marland Kitchen Moist mucosa without lesions . Neck supple and nontender. No palpable supraclavicular or cervical adenopathy. Normal sized without goiter. Respiratory WNL. No retractions.. Cardiovascular Pedal Pulses WNL. No clubbing, cyanosis or edema. Chest Breasts symmetical and no nipple discharge.. Breast tissue WNL, no masses, lumps, or tenderness.. Lymphatic No adneopathy. No adenopathy. No adenopathy. Musculoskeletal Adexa without tenderness or enlargement.. Digits and nails w/o clubbing, cyanosis, infection, petechiae, ischemia, or inflammatory conditions.. Integumentary (Hair, Skin) No suspicious lesions. No crepitus  or fluctuance. No peri-wound warmth or erythema. No masses.Marland Kitchen Psychiatric Judgement and insight Intact.. No evidence of depression, anxiety, or agitation.. Notes the wound on the right lower extremity is covered with Apligraf and looks good and there is no surrounding issues and the edema is minimal Electronic Signature(s) Signed: 07/05/2015 9:51:13 AM By: Evlyn Kanner MD, FACS Entered By: Evlyn Kanner on 07/05/2015 09:51:12 [place], [name] ([id number]) -------------------------------------------------------------------------------- Physician Orders Details Patient Name: Jutras, Bianca C. Date of Service: 07/05/2015 9:30 AM Medical Record Number: [id number] Patient Account Number: [id number] Date of Birth/Sex: 1971/03/10 (44 y.o. Male) Treating RN: Clover Mealy, RN, BSN, [place] Sink Primary Care Physician: Mayford Knife, ERIC Other Clinician: Referring Physician: Mayford Knife, ERIC Treating Physician/Extender: Rudene Re in Treatment: 7 Verbal / Phone Orders: Yes Clinician: Afful, RN, BSN, Rita Read Back and Verified: Yes Diagnosis Coding Wound Cleansing Wound #1 Right,Lateral Lower Leg o Cleanse wound with mild soap and water Primary Wound Dressing o Drawtex Dressing Change Frequency Wound #1 Right,Lateral Lower Leg o Change dressing every week Follow-up Appointments Wound #1 Right,Lateral Lower Leg o Return Appointment in 1 week. Edema Control o Unna Boot to Right Lower Extremity Advanced Therapies Wound #1 Right,Lateral Lower Leg o Apligraf application in clinic; including contact layer, fixation with steri strips, dry gauze and cover dressing. - Remains in place for week 2. Electronic Signature(s) Signed: 07/05/2015 9:44:15 AM By: Elpidio Eric BSN, RN Signed: 07/05/2015 4:23:40 PM By: Evlyn Kanner MD, FACS Entered By: Elpidio Eric on 07/05/2015 09:44:15 [place], [name] ([id number]) -------------------------------------------------------------------------------- Problem  List Details Patient Name: Soderquist, Mazen C. Date of Service: 07/05/2015 9:30 AM Medical Record Number: [id number] Patient Account Number: [id number] Date of Birth/Sex: 06-04-1971 (44 y.o. Male) Treating RN: Huel Coventry Primary Care Physician: Mayford Knife, ERIC Other Clinician: Referring Physician: Mayford Knife, ERIC Treating Physician/Extender: Rudene Re in Treatment: 7 Active Problems ICD-10 Encounter Code Description Active Date Diagnosis I87.313 Chronic venous hypertension (idiopathic) with ulcer of 05/14/2015 Yes bilateral lower extremity L97.212 Non-pressure chronic ulcer of right calf with fat layer 05/14/2015 Yes exposed L97.222 Non-pressure  chronic ulcer of left calf with fat layer 05/14/2015 Yes exposed E66.01 Morbid (severe) obesity due to excess calories 05/14/2015 Yes I89.0 Lymphedema, not elsewhere classified 05/14/2015 Yes Inactive Problems Resolved Problems Electronic Signature(s) Signed: 07/05/2015 9:50:11 AM By: Evlyn Kanner MD, FACS Entered By: Evlyn Kanner on 07/05/2015 09:50:11 [place], [name] ([id number]) -------------------------------------------------------------------------------- Progress Note Details Patient Name: Hunkins, Joao C. Date of Service: 07/05/2015 9:30 AM Medical Record Number: [id number] Patient Account Number: [id number] Date of Birth/Sex: 1971/06/02 (44 y.o. Male) Treating RN: Huel Coventry Primary Care Physician: Mayford Knife, ERIC Other Clinician: Referring Physician: Mayford Knife, ERIC Treating Physician/Extender: Rudene Re in Treatment: 7 Subjective Chief Complaint Information obtained from Patient Patient presents for treatment of an open ulcer due to venous insufficiency. 44 year old gentleman who has bilateral lower leg swelling and ulceration for the last 3 months. History of Present Illness (HPI) The following HPI elements were documented for the patient's wound: Location: bilateral lower extremity swelling and ulceration Quality:  Patient reports experiencing a dull pain to affected area(s). Severity: Patient states wound are getting worse. Duration: Patient has had the wound for > 3 months prior to seeking treatment at the wound center Timing: Pain in wound is Intermittent (comes and goes Context: The wound appeared gradually over time Modifying Factors: Other treatment(s) tried include: has seen his PCP and has been on Keflex. Associated Signs and Symptoms: Patient reports having difficulty standing for long periods. This 44 year old gentleman has been referred from his PCPs office where he was seen by Esperanza Sheets, PA. The patient is known to be seen most recently for morbidly obese, sleep apnea, chronic lower extremity edema, osteoarthritis, pain in the legs. His past medical history is significant for chronic venous insufficiency, pain in the back, morbid obesity, cardiomegaly, osteoarthritis of the hip, sleep apnea, kidney stones, hematuria, ulcers both lower extremities. He had a left hip arthroplasty in April of this year. He has never had venous duplex studies done on his lower extremities though he has been to the wound center several years ago. He has also not been wearing his compression stockings. 05/21/2015. He has been compliant with elevation of the limb and wearing his sleep apnea machine so that he can breathe better and try and get to sleep in a bed. His vascular studies as scheduled for early August. 05/28/2015 -- he has his vascular appointment tomorrow afternoon and other than that has been doing very well. 06/04/2015 -- he had his vascular studies done last week and he goes back for them to be red and to get the opinion of the vascular surgeon. His insurance has not given him the Jobst stocking yet and he is otherwise doing fine. 06/11/2015 -- he did visit the vascular surgeons and though we don't have the official report the patient says that his study was complete and no surgical intervention was  recommended the present time. He had compression stockings recommended for his venous hypertension and at some stage if he continued to have problems they would recommended lymphedema pumps. We will await official report regarding his vascular workup. Jankovich, Rudolf C. ([id number]) In the meanwhile his wound is doing well and he has insurance approval for Apligraf and we will proceed with the first application of Apligraf today. 06/18/2015 -- Seen by Dr. Gilda Crease on 06/09/2015. Duplex ultrasound of the lower extremity showed normal deep venous system, superficial reflux was not present on the left and in a trivial segment on the right. Recommendations were for 20-30 mmHg compression stockings and at some stage  later may be lymphedema pumps. 06/28/2015 -- he has done fine in this last week and is here for a second application of Apligraf. 07/05/2015 -- he is here for a wound check today and is anticipating he will be here next week prior to his right hip surgery on September 19 Objective Constitutional Pulse regular. Respirations normal and unlabored. Afebrile. Vitals Time Taken: 9:34 AM, Height: 72 in, Weight: 386 lbs, BMI: 52.3, Temperature: 98.6 F, Pulse: 79 bpm, Respiratory Rate: 19 breaths/min, Blood Pressure: 136/71 mmHg. Eyes Nonicteric. Reactive to light. Ears, Nose, Mouth, and Throat Lips, teeth, and gums WNL.Marland Kitchen Moist mucosa without lesions . Neck supple and nontender. No palpable supraclavicular or cervical adenopathy. Normal sized without goiter. Respiratory WNL. No retractions.. Cardiovascular Pedal Pulses WNL. No clubbing, cyanosis or edema. Chest Breasts symmetical and no nipple discharge.. Breast tissue WNL, no masses, lumps, or tenderness.. Lymphatic No adneopathy. No adenopathy. No adenopathy. Musculoskeletal Adexa without tenderness or enlargement.. Digits and nails w/o clubbing, cyanosis, infection, petechiae, ischemia, or inflammatory conditions.Marland Kitchen Cargle,  Azavion C. ([id number]) Psychiatric Judgement and insight Intact.. No evidence of depression, anxiety, or agitation.. General Notes: the wound on the right lower extremity is covered with Apligraf and looks good and there is no surrounding issues and the edema is minimal Integumentary (Hair, Skin) No suspicious lesions. No crepitus or fluctuance. No peri-wound warmth or erythema. No masses.. Wound #1 status is Open. Original cause of wound was Gradually Appeared. The wound is located on the Right,Lateral Lower Leg. The wound measures 1.5cm length x 1.5cm width x 0.1cm depth; 1.767cm^2 area and 0.177cm^3 volume. The wound is limited to skin breakdown. There is no tunneling or undermining noted. There is a medium amount of serous drainage noted. The wound margin is indistinct and nonvisible. There is large (67-100%) pink granulation within the wound bed. There is no necrotic tissue within the wound bed. The periwound skin appearance exhibited: Localized Edema, Moist. The periwound skin appearance did not exhibit: Maceration. Periwound temperature was noted as No Abnormality. General Notes: Apligraf in place Assessment Active Problems ICD-10 I87.313 - Chronic venous hypertension (idiopathic) with ulcer of bilateral lower extremity L97.212 - Non-pressure chronic ulcer of right calf with fat layer exposed L97.222 - Non-pressure chronic ulcer of left calf with fat layer exposed E66.01 - Morbid (severe) obesity due to excess calories I89.0 - Lymphedema, not elsewhere classified We will reapply an Unna's boot and continue seeing him next week prior to his surgery. No Apligraf to be applied next week. Plan Wound Cleansing: Wound #1 Right,Lateral Lower Leg: Cleanse wound with mild soap and water Primary Wound Dressing: Drawtex Echeverri, Jaclyn C. ([id number]) Dressing Change Frequency: Wound #1 Right,Lateral Lower Leg: Change dressing every week Follow-up Appointments: Wound #1 Right,Lateral  Lower Leg: Return Appointment in 1 week. Edema Control: Unna Boot to Right Lower Extremity Advanced Therapies: Wound #1 Right,Lateral Lower Leg: Apligraf application in clinic; including contact layer, fixation with steri strips, dry gauze and cover dressing. - Remains in place for week 2. We will reapply an Unna's boot and continue seeing him next week prior to his surgery. No Apligraf to be applied next week. Electronic Signature(s) Signed: 07/05/2015 9:51:52 AM By: Evlyn Kanner MD, FACS Entered By: Evlyn Kanner on 07/05/2015 09:51:52 [place], [name] ([id number]) -------------------------------------------------------------------------------- SuperBill Details Patient Name: Oscar Mirza C. Date of Service: 07/05/2015 Medical Record Number: [id number] Patient Account Number: [id number] Date of Birth/Sex: 06/03/1971 (44 y.o. Male) Treating RN: Clover Mealy, RN, BSN, [place] Sink Primary Care Physician: Mayford Knife, ERIC Other Clinician: Referring  Physician: Mayford Knife, ERIC Treating Physician/Extender: Rudene Re in Treatment: 7 Diagnosis Coding ICD-10 Codes Code Description I87.313 Chronic venous hypertension (idiopathic) with ulcer of bilateral lower extremity L97.212 Non-pressure chronic ulcer of right calf with fat layer exposed L97.222 Non-pressure chronic ulcer of left calf with fat layer exposed E66.01 Morbid (severe) obesity due to excess calories I89.0 Lymphedema, not elsewhere classified Facility Procedures CPT4 Code: [id number] Description: (Facility Use Only) [phone] - APPLY Roland Rack BOOT RT Modifier: Quantity: 1 Physician Procedures CPT4: Description Modifier Quantity Code [id number] - WC PHYS LEVEL 3 - EST PT 1 ICD-10 Description Diagnosis I87.313 Chronic venous hypertension (idiopathic) with ulcer of bilateral lower extremity L97.212 Non-pressure chronic ulcer of right calf  with fat layer exposed L97.222 Non-pressure chronic ulcer of left calf with fat layer exposed E66.01  Morbid (severe) obesity due to excess calories Electronic Signature(s) Signed: 07/05/2015 9:53:43 AM By: Evlyn Kanner MD, FACS Previous Signature: 07/05/2015 9:53:32 AM Version By: Evlyn Kanner MD, FACS Previous Signature: 07/05/2015 9:47:02 AM Version By: Elpidio Eric BSN, RN Entered By: Evlyn Kanner on 07/05/2015 09:53:43

## 2015-07-12 ENCOUNTER — Encounter: Payer: No Typology Code available for payment source | Admitting: Surgery

## 2015-07-12 DIAGNOSIS — L97212 Non-pressure chronic ulcer of right calf with fat layer exposed: Secondary | ICD-10-CM | POA: Diagnosis not present

## 2015-07-13 NOTE — Progress Notes (Signed)
NAVDEEP, HALT ([id number]) Visit Report for 07/12/2015 Chief Complaint Document Details Patient Name: [name], [name]. Date of Service: 07/12/2015 8:00 AM Medical Record Number: [id number] Patient Account Number: [id number] Date of Birth/Sex: 1971-02-05 (44 y.o. Male) Treating RN: Clover Mealy, RN, BSN, [place] Sink Primary Care Physician: Mayford Knife, ERIC Other Clinician: Referring Physician: Mayford Knife, ERIC Treating Physician/Extender: Rudene Re in Treatment: 8 Information Obtained from: Patient Chief Complaint Patient presents for treatment of an open ulcer due to venous insufficiency. 44 year old gentleman who has bilateral lower leg swelling and ulceration for the last 3 months. Electronic Signature(s) Signed: 07/12/2015 9:17:51 AM By: Evlyn Kanner MD, FACS Entered By: Evlyn Kanner on 07/12/2015 09:17:51 Pangborn, [name] ([id number]) -------------------------------------------------------------------------------- HPI Details Patient Name: Oscar Mirza C. Date of Service: 07/12/2015 8:00 AM Medical Record Number: [id number] Patient Account Number: [id number] Date of Birth/Sex: 1971-02-14 (44 y.o. Male) Treating RN: Clover Mealy, RN, BSN, [place] Sink Primary Care Physician: Mayford Knife, ERIC Other Clinician: Referring Physician: TURNER, ERIC Treating Physician/Extender: Rudene Re in Treatment: 8 History of Present Illness Location: bilateral lower extremity swelling and ulceration Quality: Patient reports experiencing a dull pain to affected area(s). Severity: Patient states wound are getting worse. Duration: Patient has had the wound for > 3 months prior to seeking treatment at the wound center Timing: Pain in wound is Intermittent (comes and goes Context: The wound appeared gradually over time Modifying Factors: Other treatment(s) tried include: has seen his PCP and has been on Keflex. Associated Signs and Symptoms: Patient reports having difficulty standing for long periods. HPI  Description: This 44 year old gentleman has been referred from his PCPs office where he was seen by Esperanza Sheets, PA. The patient is known to be seen most recently for morbidly obese, sleep apnea, chronic lower extremity edema, osteoarthritis, pain in the legs. His past medical history is significant for chronic venous insufficiency, pain in the back, morbid obesity, cardiomegaly, osteoarthritis of the hip, sleep apnea, kidney stones, hematuria, ulcers both lower extremities. He had a left hip arthroplasty in April of this year. He has never had venous duplex studies done on his lower extremities though he has been to the wound center several years ago. He has also not been wearing his compression stockings. 05/21/2015. He has been compliant with elevation of the limb and wearing his sleep apnea machine so that he can breathe better and try and get to sleep in a bed. His vascular studies as scheduled for early August. 05/28/2015 -- he has his vascular appointment tomorrow afternoon and other than that has been doing very well. 06/04/2015 -- he had his vascular studies done last week and he goes back for them to be red and to get the opinion of the vascular surgeon. His insurance has not given him the Jobst stocking yet and he is otherwise doing fine. 06/11/2015 -- he did visit the vascular surgeons and though we don't have the official report the patient says that his study was complete and no surgical intervention was recommended the present time. He had compression stockings recommended for his venous hypertension and at some stage if he continued to have problems they would recommended lymphedema pumps. We will await official report regarding his vascular workup. In the meanwhile his wound is doing well and he has insurance approval for Apligraf and we will proceed with the first application of Apligraf today. 06/18/2015 -- Seen by Dr. Gilda Crease on 06/09/2015. Duplex ultrasound of the lower  extremity showed normal deep venous system, superficial reflux was not present on the left and  in a trivial segment on the right. Recommendations were for 20-30 mmHg compression stockings and at some stage later may be lymphedema pumps. 06/28/2015 -- he has done fine in this last week and is here for a second application of Apligraf. 07/05/2015 -- he is here for a wound check today and is anticipating he will be here next week prior to his KAYDE, WAREHIME. ([id number]) right hip surgery on September 19 07/12/2015 -- unfortunately his orthopedic procedure was canceled and he is here to continue with wound care treatment till he has complete closure and he can reschedule his surgery. Electronic Signature(s) Signed: 07/12/2015 9:18:19 AM By: Evlyn Kanner MD, FACS Entered By: Evlyn Kanner on 07/12/2015 09:18:19 [place], [name] ([id number]) -------------------------------------------------------------------------------- Physical Exam Details Patient Name: Oscar, [name] Date of Service: 07/12/2015 8:00 AM Medical Record Number: [id number] Patient Account Number: [id number] Date of Birth/Sex: [birth year] (44 y.o. Male) Treating RN: Clover Mealy, RN, BSN, [place] Sink Primary Care Physician: Mayford Knife, ERIC Other Clinician: Referring Physician: Mayford Knife, ERIC Treating Physician/Extender: Rudene Re in Treatment: 8 Constitutional . Pulse regular. Respirations normal and unlabored. Afebrile. . Eyes Nonicteric. Reactive to light. Ears, Nose, Mouth, and Throat Lips, teeth, and gums WNL.Marland Kitchen Moist mucosa without lesions . Neck supple and nontender. No palpable supraclavicular or cervical adenopathy. Normal sized without goiter. Respiratory WNL. No retractions.. Cardiovascular Pedal Pulses WNL. No clubbing, cyanosis or edema. Lymphatic No adneopathy. No adenopathy. No adenopathy. Musculoskeletal Adexa without tenderness or enlargement.. Digits and nails w/o clubbing, cyanosis, infection,  petechiae, ischemia, or inflammatory conditions.. Integumentary (Hair, Skin) No suspicious lesions. No crepitus or fluctuance. No peri-wound warmth or erythema. No masses.Marland Kitchen Psychiatric Judgement and insight Intact.. No evidence of depression, anxiety, or agitation.. Notes the wound looks excellent with good epithelialization and parts of it and the edema is minimal. Electronic Signature(s) Signed: 07/12/2015 9:18:46 AM By: Evlyn Kanner MD, FACS Entered By: Evlyn Kanner on 07/12/2015 09:18:45 Greening, [name] ([id number]) -------------------------------------------------------------------------------- Physician Orders Details Patient Name: Fiscus, Moiz C. Date of Service: 07/12/2015 8:00 AM Medical Record Number: [id number] Patient Account Number: [id number] Date of Birth/Sex: [birth year] (44 y.o. Male) Treating RN: Curtis Sites Primary Care Physician: Mayford Knife, ERIC Other Clinician: Referring Physician: Mayford Knife, ERIC Treating Physician/Extender: Rudene Re in Treatment: 8 Verbal / Phone Orders: Yes Clinician: Curtis Sites Read Back and Verified: Yes Diagnosis Coding Wound Cleansing Wound #1 Right,Lateral Lower Leg o Cleanse wound with mild soap and water Primary Wound Dressing o Promogran o Drawtex Dressing Change Frequency Wound #1 Right,Lateral Lower Leg o Change dressing every week Follow-up Appointments Wound #1 Right,Lateral Lower Leg o Return Appointment in 1 week. Edema Control o Unna Boot to Right Lower Extremity Notes order Apligraf for next visit Electronic Signature(s) Signed: 07/12/2015 4:21:04 PM By: Evlyn Kanner MD, FACS Signed: 07/12/2015 6:07:15 PM By: Curtis Sites Entered By: Curtis Sites on 07/12/2015 08:55:34 Derksen, [name] ([id number]) -------------------------------------------------------------------------------- Problem List Details Patient Name: Henricks, Bless C. Date of Service: 07/12/2015 8:00 AM Medical  Record Number: [id number] Patient Account Number: [id number] Date of Birth/Sex: Sep 26, 1971 (44 y.o. Male) Treating RN: Clover Mealy, RN, BSN, [place] Sink Primary Care Physician: Mayford Knife, ERIC Other Clinician: Referring Physician: Mayford Knife, ERIC Treating Physician/Extender: Rudene Re in Treatment: 8 Active Problems ICD-10 Encounter Code Description Active Date Diagnosis I87.313 Chronic venous hypertension (idiopathic) with ulcer of 05/14/2015 Yes bilateral lower extremity L97.212 Non-pressure chronic ulcer of right calf with fat layer 05/14/2015 Yes exposed L97.222 Non-pressure chronic ulcer of left calf with fat layer 05/14/2015 Yes exposed E66.01 Morbid (severe) obesity due  to excess calories 05/14/2015 Yes I89.0 Lymphedema, not elsewhere classified 05/14/2015 Yes Inactive Problems Resolved Problems Electronic Signature(s) Signed: 07/12/2015 9:17:45 AM By: Evlyn Kanner MD, FACS Entered By: Evlyn Kanner on 07/12/2015 09:17:45 Bye, Hosea C. ([id number]) -------------------------------------------------------------------------------- Progress Note Details Patient Name: Faulds, Jacobie C. Date of Service: 07/12/2015 8:00 AM Medical Record Number: [id number] Patient Account Number: [id number] Date of Birth/Sex: May 17, 1971 (44 y.o. Male) Treating RN: Clover Mealy, RN, BSN, [place] Sink Primary Care Physician: Mayford Knife, ERIC Other Clinician: Referring Physician: Mayford Knife, ERIC Treating Physician/Extender: Rudene Re in Treatment: 8 Subjective Chief Complaint Information obtained from Patient Patient presents for treatment of an open ulcer due to venous insufficiency. 44 year old gentleman who has bilateral lower leg swelling and ulceration for the last 3 months. History of Present Illness (HPI) The following HPI elements were documented for the patient's wound: Location: bilateral lower extremity swelling and ulceration Quality: Patient reports experiencing a dull pain to affected  area(s). Severity: Patient states wound are getting worse. Duration: Patient has had the wound for > 3 months prior to seeking treatment at the wound center Timing: Pain in wound is Intermittent (comes and goes Context: The wound appeared gradually over time Modifying Factors: Other treatment(s) tried include: has seen his PCP and has been on Keflex. Associated Signs and Symptoms: Patient reports having difficulty standing for long periods. This 44 year old gentleman has been referred from his PCPs office where he was seen by Esperanza Sheets, PA. The patient is known to be seen most recently for morbidly obese, sleep apnea, chronic lower extremity edema, osteoarthritis, pain in the legs. His past medical history is significant for chronic venous insufficiency, pain in the back, morbid obesity, cardiomegaly, osteoarthritis of the hip, sleep apnea, kidney stones, hematuria, ulcers both lower extremities. He had a left hip arthroplasty in April of this year. He has never had venous duplex studies done on his lower extremities though he has been to the wound center several years ago. He has also not been wearing his compression stockings. 05/21/2015. He has been compliant with elevation of the limb and wearing his sleep apnea machine so that he can breathe better and try and get to sleep in a bed. His vascular studies as scheduled for early August. 05/28/2015 -- he has his vascular appointment tomorrow afternoon and other than that has been doing very well. 06/04/2015 -- he had his vascular studies done last week and he goes back for them to be red and to get the opinion of the vascular surgeon. His insurance has not given him the Jobst stocking yet and he is otherwise doing fine. 06/11/2015 -- he did visit the vascular surgeons and though we don't have the official report the patient says that his study was complete and no surgical intervention was recommended the present time. He had compression  stockings recommended for his venous hypertension and at some stage if he continued to have problems they would recommended lymphedema pumps. We will await official report regarding his vascular workup. Umbach, Kinnie C. ([id number]) In the meanwhile his wound is doing well and he has insurance approval for Apligraf and we will proceed with the first application of Apligraf today. 06/18/2015 -- Seen by Dr. Gilda Crease on 06/09/2015. Duplex ultrasound of the lower extremity showed normal deep venous system, superficial reflux was not present on the left and in a trivial segment on the right. Recommendations were for 20-30 mmHg compression stockings and at some stage later may be lymphedema pumps. 06/28/2015 -- he has done fine in this last  week and is here for a second application of Apligraf. 07/05/2015 -- he is here for a wound check today and is anticipating he will be here next week prior to his right hip surgery on September 19 07/12/2015 -- unfortunately his orthopedic procedure was canceled and he is here to continue with wound care treatment till he has complete closure and he can reschedule his surgery. Objective Constitutional Pulse regular. Respirations normal and unlabored. Afebrile. Vitals Time Taken: 8:23 AM, Height: 72 in, Weight: 386 lbs, BMI: 52.3, Temperature: 98.4 F, Pulse: 76 bpm, Respiratory Rate: 20 breaths/min, Blood Pressure: 138/74 mmHg. Eyes Nonicteric. Reactive to light. Ears, Nose, Mouth, and Throat Lips, teeth, and gums WNL.Marland Kitchen Moist mucosa without lesions . Neck supple and nontender. No palpable supraclavicular or cervical adenopathy. Normal sized without goiter. Respiratory WNL. No retractions.. Cardiovascular Pedal Pulses WNL. No clubbing, cyanosis or edema. Lymphatic No adneopathy. No adenopathy. No adenopathy. Musculoskeletal Adexa without tenderness or enlargement.. Digits and nails w/o clubbing, cyanosis, infection, petechiae, ischemia, or  inflammatory conditions.Marland Kitchen Psychiatric Judgement and insight Intact.. No evidence of depression, anxiety, or agitation.Marland Kitchen Leamy, Jakyri C. ([id number]) General Notes: the wound looks excellent with good epithelialization and parts of it and the edema is minimal. Integumentary (Hair, Skin) No suspicious lesions. No crepitus or fluctuance. No peri-wound warmth or erythema. No masses.. Wound #1 status is Open. Original cause of wound was Gradually Appeared. The wound is located on the Right,Lateral Lower Leg. The wound measures 1.5cm length x 1.7cm width x 0.1cm depth; 2.003cm^2 area and 0.2cm^3 volume. The wound is limited to skin breakdown. There is no tunneling or undermining noted. There is a medium amount of serous drainage noted. The wound margin is indistinct and nonvisible. There is large (67-100%) pink granulation within the wound bed. There is no necrotic tissue within the wound bed. The periwound skin appearance exhibited: Localized Edema, Moist. The periwound skin appearance did not exhibit: Maceration. Periwound temperature was noted as No Abnormality. Assessment Active Problems ICD-10 I87.313 - Chronic venous hypertension (idiopathic) with ulcer of bilateral lower extremity L97.212 - Non-pressure chronic ulcer of right calf with fat layer exposed L97.222 - Non-pressure chronic ulcer of left calf with fat layer exposed E66.01 - Morbid (severe) obesity due to excess calories I89.0 - Lymphedema, not elsewhere classified We will use collagen and an Unna's boot today and get him his next Apligraf next Thursday. Management plan has been discussed with him in detail and he is agreeable. Plan Wound Cleansing: Wound #1 Right,Lateral Lower Leg: Cleanse wound with mild soap and water Primary Wound Dressing: Promogran Drawtex Dressing Change Frequency: Wound #1 Right,Lateral Lower Leg: Bencosme, Seith C. ([id number]) Change dressing every week Follow-up Appointments: Wound #1  Right,Lateral Lower Leg: Return Appointment in 1 week. Edema Control: Unna Boot to Right Lower Extremity General Notes: order Apligraf for next visit We will use collagen and an Unna's boot today and get him his next Apligraf next Thursday. Management plan has been discussed with him in detail and he is agreeable. Electronic Signature(s) Signed: 07/12/2015 9:19:42 AM By: Evlyn Kanner MD, FACS Entered By: Evlyn Kanner on 07/12/2015 09:19:41 [place]enton, [name] ([id number]) -------------------------------------------------------------------------------- SuperBill Details Patient Name: Oscar Mirza C. Date of Service: 07/12/2015 Medical Record Number: [id number] Patient Account Number: [id number] Date of Birth/Sex: 07/25/1971 (44 y.o. Male) Treating RN: Curtis Sites Primary Care Physician: Mayford Knife, ERIC Other Clinician: Referring Physician: Mayford Knife, ERIC Treating Physician/Extender: Rudene Re in Treatment: 8 Diagnosis Coding ICD-10 Codes Code Description I87.313 Chronic venous hypertension (idiopathic) with ulcer  of bilateral lower extremity L97.212 Non-pressure chronic ulcer of right calf with fat layer exposed L97.222 Non-pressure chronic ulcer of left calf with fat layer exposed E66.01 Morbid (severe) obesity due to excess calories I89.0 Lymphedema, not elsewhere classified Facility Procedures CPT4: Description Modifier Quantity Code [id number] (Facility Use Only) [id number] - APPLY MULTLAY COMPRS LWR RT 1 LEG Physician Procedures CPT4: Description Modifier Quantity Code [id number] - WC PHYS LEVEL 3 - EST PT 1 ICD-10 Description Diagnosis I87.313 Chronic venous hypertension (idiopathic) with ulcer of bilateral lower extremity L97.212 Non-pressure chronic ulcer of right calf  with fat layer exposed L97.222 Non-pressure chronic ulcer of left calf with fat layer exposed I89.0 Lymphedema, not elsewhere classified Electronic Signature(s) Signed: 07/12/2015 9:20:01 AM By:  Evlyn Kanner MD, FACS Entered By: Evlyn Kanner on 07/12/2015 09:20:01

## 2015-07-13 NOTE — Progress Notes (Signed)
[name] ([id number]) Visit Report for 07/12/2015 Arrival Information Details Patient Name: [name], [name]. Date of Service: 07/12/2015 8:00 AM Medical Record Number: [id number] Patient Account Number: [id number] Date of Birth/Sex: [birth year] (44 y.o. Male) Treating RN: Curtis Sites Primary Care Physician: Mayford Knife, ERIC Other Clinician: Referring Physician: Mayford Knife, ERIC Treating Physician/Extender: Rudene Re in Treatment: 8 Visit Information History Since Last Visit Added or deleted any medications: No Patient Arrived: Ambulatory Any new allergies or adverse reactions: No Arrival Time: 08:18 Had a fall or experienced change in No Accompanied By: spouse activities of daily living that may affect Transfer Assistance: None risk of falls: Patient Identification Verified: Yes Signs or symptoms of abuse/neglect since last No Secondary Verification Process Yes visito Completed: Hospitalized since last visit: No Patient Has Alerts: Yes Pain Present Now: Yes Patient Alerts: Patient on Blood Thinner Electronic Signature(s) Signed: 07/12/2015 6:07:15 PM By: Curtis Sites Entered By: Curtis Sites on 07/12/2015 08:18:43 Maciver, [name] ([id number]) -------------------------------------------------------------------------------- Encounter Discharge Information Details Patient Name: Oscar Mirza C. Date of Service: 07/12/2015 8:00 AM Medical Record Number: [id number] Patient Account Number: [id number] Date of Birth/Sex: [birth year] (44 y.o. Male) Treating RN: Curtis Sites Primary Care Physician: Mayford Knife, ERIC Other Clinician: Referring Physician: Mayford Knife, ERIC Treating Physician/Extender: Rudene Re in Treatment: 8 Encounter Discharge Information Items Discharge Pain Level: 0 Discharge Condition: Stable Ambulatory Status: Ambulatory Discharge Destination: Home Transportation: Private Auto Accompanied By: spouse Schedule Follow-up Appointment:  Yes Medication Reconciliation completed and provided to Patient/Care No Tylen Leverich: Provided on Clinical Summary of Care: 07/12/2015 Form Type Recipient Paper Patient EM Electronic Signature(s) Signed: 07/12/2015 9:11:48 AM By: Gwenlyn Perking Entered By: Gwenlyn Perking on 07/12/2015 09:11:48 Mole, Marik C. ([id number]) -------------------------------------------------------------------------------- Lower Extremity Assessment Details Patient Name: Garabedian, Lino C. Date of Service: 07/12/2015 8:00 AM Medical Record Number: [id number] Patient Account Number: [id number] Date of Birth/Sex: [birth year] (44 y.o. Male) Treating RN: Curtis Sites Primary Care Physician: Mayford Knife, ERIC Other Clinician: Referring Physician: Mayford Knife, ERIC Treating Physician/Extender: Rudene Re in Treatment: 8 Edema Assessment Assessed: [Left: No] [Right: No] Edema: [Left: Ye] [Right: s] Calf Left: Right: Point of Measurement: 33 cm From Medial Instep cm 45.2 cm Ankle Left: Right: Point of Measurement: 11 cm From Medial Instep cm 29.1 cm Vascular Assessment Pulses: Posterior Tibial Dorsalis Pedis Palpable: [Right:Yes] Extremity colors, hair growth, and conditions: Extremity Color: [Right:Hyperpigmented] Hair Growth on Extremity: [Right:No] Temperature of Extremity: [Right:Warm] Capillary Refill: [Right:< 3 seconds] Toe Nail Assessment Left: Right: Thick: Yes Discolored: Yes Deformed: Yes Improper Length and Hygiene: No Electronic Signature(s) Signed: 07/12/2015 6:07:15 PM By: Curtis Sites Entered By: Curtis Sites on 07/12/2015 08:31:24 Fluegge, Callaway CMarland Kitchen ([id number]) -------------------------------------------------------------------------------- Multi Wound Chart Details Patient Name: [name], [name] Date of Service: 07/12/2015 8:00 AM Medical Record Number: [id number] Patient Account Number: [id number] Date of Birth/Sex: 1970-11-16 (44 y.o. Male) Treating RN: Curtis Sites Primary Care Physician: Mayford Knife, ERIC Other Clinician: Referring Physician: Mayford Knife, ERIC Treating Physician/Extender: Rudene Re in Treatment: 8 Vital Signs Height(in): 72 Pulse(bpm): 76 Weight(lbs): 386 Blood Pressure 138/74 (mmHg): Body Mass Index(BMI): 52 Temperature(F): 98.4 Respiratory Rate 20 (breaths/min): Photos: [1:No Photos] [N/A:N/A] Wound Location: [1:Right Lower Leg - Lateral N/A] Wounding Event: [1:Gradually Appeared] [N/A:N/A] Primary Etiology: [1:Venous Leg Ulcer] [N/A:N/A] Comorbid History: [1:Sleep Apnea, Rheumatoid N/A Arthritis] Date Acquired: [1:01/26/2015] [N/A:N/A] Weeks of Treatment: [1:8] [N/A:N/A] Wound Status: [1:Open] [N/A:N/A] Measurements L x W x D 1.5x1.7x0.1 [N/A:N/A] (cm) Area (cm) : [1:2.003] [N/A:N/A] Volume (cm) : [1:0.2] [N/A:N/A] % Reduction in Area: [1:79.80%] [N/A:N/A] % Reduction in  Volume: 79.80% [N/A:N/A] Classification: [1:Full Thickness Without Exposed Support Structures] [N/A:N/A] Exudate Amount: [1:Medium] [N/A:N/A] Exudate Type: [1:Serous] [N/A:N/A] Exudate Color: [1:amber] [N/A:N/A] Wound Margin: [1:Indistinct, nonvisible] [N/A:N/A] Granulation Amount: [1:Large (67-100%)] [N/A:N/A] Granulation Quality: [1:Pink, Hyper-granulation N/A] Necrotic Amount: [1:None Present (0%)] [N/A:N/A] Exposed Structures: [1:Fascia: No Fat: No Tendon: No Muscle: No Joint: No] [N/A:N/A] Bone: No Limited to Skin Breakdown Epithelialization: Small (1-33%) N/A N/A Periwound Skin Texture: Edema: Yes N/A N/A Periwound Skin Moist: Yes N/A N/A Moisture: Maceration: No Periwound Skin Color: No Abnormalities Noted N/A N/A Temperature: No Abnormality N/A N/A Tenderness on No N/A N/A Palpation: Wound Preparation: Ulcer Cleansing: N/A N/A Rinsed/Irrigated with Saline, Other: water and soap Topical Anesthetic Applied: Other: lidocaine 4% Treatment Notes Electronic Signature(s) Signed: 07/12/2015 6:07:15 PM By: Curtis Sites Entered By: Curtis Sites on 07/12/2015 08:49:37 Esteve, [name] ([id number]) -------------------------------------------------------------------------------- Multi-Disciplinary Care Plan Details Patient Name: Oscar Mirza C. Date of Service: 07/12/2015 8:00 AM Medical Record Number: [id number] Patient Account Number: [id number] Date of Birth/Sex: [birth year] (44 y.o. Male) Treating RN: Curtis Sites Primary Care Physician: Mayford Knife, ERIC Other Clinician: Referring Physician: Mayford Knife, ERIC Treating Physician/Extender: Rudene Re in Treatment: 8 Active Inactive Orientation to the Wound Care Program Nursing Diagnoses: Knowledge deficit related to the wound healing center program Goals: Patient/caregiver will verbalize understanding of the Wound Healing Center Program Date Initiated: 05/14/2015 Goal Status: Active Interventions: Provide education on orientation to the wound center Notes: Venous Leg Ulcer Nursing Diagnoses: Potential for venous Insuffiency (use before diagnosis confirmed) Goals: Non-invasive venous studies are completed as ordered Date Initiated: 05/14/2015 Goal Status: Active Interventions: Assess peripheral edema status every visit. Treatment Activities: Non-invasive vascular studies : 07/12/2015 Notes: Wound/Skin Impairment Nursing Diagnoses: Impaired tissue integrity Woolever, Kallon C. ([id number]) Goals: Patient/caregiver will verbalize understanding of skin care regimen Date Initiated: 05/14/2015 Goal Status: Active Ulcer/skin breakdown will heal within 14 weeks Date Initiated: 05/14/2015 Goal Status: Active Interventions: Assess patient/caregiver ability to obtain necessary supplies Notes: Electronic Signature(s) Signed: 07/12/2015 6:07:15 PM By: Curtis Sites Entered By: Curtis Sites on 07/12/2015 08:49:29 Lady, Tavion C. ([id number]) -------------------------------------------------------------------------------- Pain  Assessment Details Patient Name: Oscar Mirza C. Date of Service: 07/12/2015 8:00 AM Medical Record Number: [id number] Patient Account Number: [id number] Date of Birth/Sex: [birth year] (44 y.o. Male) Treating RN: Curtis Sites Primary Care Physician: Mayford Knife, ERIC Other Clinician: Referring Physician: Mayford Knife, ERIC Treating Physician/Extender: Rudene Re in Treatment: 8 Active Problems Location of Pain Severity and Description of Pain Patient Has Paino Yes Site Locations Pain Location: Generalized Pain With Dressing Change: No Duration of the Pain. Constant / Intermittento Constant Pain Management and Medication Current Pain Management: Electronic Signature(s) Signed: 07/12/2015 6:07:15 PM By: Curtis Sites Entered By: Curtis Sites on 07/12/2015 08:18:59 Hoelzer, [name] ([id number]) -------------------------------------------------------------------------------- Patient/Caregiver Education Details Patient Name: Oscar Mirza C. Date of Service: 07/12/2015 8:00 AM Medical Record Number: [id number] Patient Account Number: [id number] Date of Birth/Gender: 01/25/1971 (44 y.o. Male) Treating RN: Curtis Sites Primary Care Physician: Mayford Knife, ERIC Other Clinician: Referring Physician: Mayford Knife, ERIC Treating Physician/Extender: Rudene Re in Treatment: 8 Education Assessment Education Provided To: Patient and Caregiver Education Topics Provided Venous: Handouts: Other: come in for rewrap if needed Methods: Demonstration, Explain/Verbal Responses: State content correctly Electronic Signature(s) Signed: 07/12/2015 6:07:15 PM By: Curtis Sites Entered By: Curtis Sites on 07/12/2015 09:08:57 Nardozzi, Ladislaus C. ([id number]) -------------------------------------------------------------------------------- Wound Assessment Details Patient Name: Witham, Saurav C. Date of Service: 07/12/2015 8:00 AM Medical Record Number: [id number] Patient Account Number:  [id number] Date of Birth/Sex: May 14, 1971 (44 y.o. Male) Treating  RN: Curtis Sites Primary Care Physician: TURNER, ERIC Other Clinician: Referring Physician: TURNER, ERIC Treating Physician/Extender: Rudene Re in Treatment: 8 Wound Status Wound Number: 1 Primary Etiology: Venous Leg Ulcer Wound Location: Right Lower Leg - Lateral Wound Status: Open Wounding Event: Gradually Appeared Comorbid Sleep Apnea, Rheumatoid History: Arthritis Date Acquired: 01/26/2015 Weeks Of Treatment: 8 Clustered Wound: No Photos Photo Uploaded By: Curtis Sites on 07/12/2015 12:32:58 Wound Measurements Length: (cm) 1.5 Width: (cm) 1.7 Depth: (cm) 0.1 Area: (cm) 2.003 Volume: (cm) 0.2 % Reduction in Area: 79.8% % Reduction in Volume: 79.8% Epithelialization: Small (1-33%) Tunneling: No Undermining: No Wound Description Full Thickness Without Exposed Foul Odor After Classification: Support Structures Wound Margin: Indistinct, nonvisible Exudate Medium Amount: Exudate Type: Serous Exudate Color: amber Cleansing: No Wound Bed Granulation Amount: Large (67-100%) Exposed Structure Granulation Quality: Pink, Hyper-granulation Fascia Exposed: No Necrotic Amount: None Present (0%) Fat Layer Exposed: No Larkin, Amahri C. ([id number]) Tendon Exposed: No Muscle Exposed: No Joint Exposed: No Bone Exposed: No Limited to Skin Breakdown Periwound Skin Texture Texture Color No Abnormalities Noted: No No Abnormalities Noted: No Localized Edema: Yes Temperature / Pain Moisture Temperature: No Abnormality No Abnormalities Noted: No Maceration: No Moist: Yes Wound Preparation Ulcer Cleansing: Rinsed/Irrigated with Saline, Other: water and soap, Topical Anesthetic Applied: Other: lidocaine 4%, Treatment Notes Wound #1 (Right, Lateral Lower Leg) 1. Cleansed with: Cleanse wound with antibacterial soap and water 2. Anesthetic Topical Lidocaine 4% cream to wound bed prior to  debridement 4. Dressing Applied: Promogran Other dressing (specify in notes) 7. Secured with Henriette Combs to Right Lower Extremity Notes drawtex Electronic Signature(s) Signed: 07/12/2015 6:07:15 PM By: Curtis Sites Entered By: Curtis Sites on 07/12/2015 08:39:40 Manseau, [name] ([id number]) -------------------------------------------------------------------------------- Vitals Details Patient Name: Oscar Mirza C. Date of Service: 07/12/2015 8:00 AM Medical Record Number: [id number] Patient Account Number: [id number] Date of Birth/Sex: 1971-05-11 (44 y.o. Male) Treating RN: Curtis Sites Primary Care Physician: Mayford Knife, ERIC Other Clinician: Referring Physician: Mayford Knife, ERIC Treating Physician/Extender: Rudene Re in Treatment: 8 Vital Signs Time Taken: 08:23 Temperature (F): 98.4 Height (in): 72 Pulse (bpm): 76 Weight (lbs): 386 Respiratory Rate (breaths/min): 20 Body Mass Index (BMI): 52.3 Blood Pressure (mmHg): 138/74 Reference Range: 80 - 120 mg / dl Electronic Signature(s) Signed: 07/12/2015 6:07:15 PM By: Curtis Sites Entered By: Curtis Sites on 07/12/2015 60:73:71

## 2015-07-19 ENCOUNTER — Encounter: Payer: No Typology Code available for payment source | Admitting: Surgery

## 2015-07-19 DIAGNOSIS — L97212 Non-pressure chronic ulcer of right calf with fat layer exposed: Secondary | ICD-10-CM | POA: Diagnosis not present

## 2015-07-20 NOTE — Progress Notes (Signed)
EULAS, SCHWEITZER ([id number]) Visit Report for 07/19/2015 Chief Complaint Document Details Patient Name: [name], [name]. Date of Service: 07/19/2015 8:00 AM Medical Record Number: [id number] Patient Account Number: [id number] Date of Birth/Sex: 09/23/1971 (44 y.o. Male) Treating RN: Curtis Sites Primary Care Physician: Mayford Knife, ERIC Other Clinician: Referring Physician: Mayford Knife, ERIC Treating Physician/Extender: Rudene Re in Treatment: 9 Information Obtained from: Patient Chief Complaint Patient presents for treatment of an open ulcer due to venous insufficiency. 44 year old gentleman who has bilateral lower leg swelling and ulceration for the last 3 months. Electronic Signature(s) Signed: 07/19/2015 9:12:32 AM By: Evlyn Kanner MD, FACS Entered By: Evlyn Kanner on 07/19/2015 09:12:32 [name] ([id number]) -------------------------------------------------------------------------------- Debridement Details Patient Name: Oscar Mirza C. Date of Service: 07/19/2015 8:00 AM Medical Record Number: [id number] Patient Account Number: [id number] Date of Birth/Sex: 02-15-1971 (44 y.o. Male) Treating RN: Curtis Sites Primary Care Physician: Mayford Knife, ERIC Other Clinician: Referring Physician: Mayford Knife, ERIC Treating Physician/Extender: Rudene Re in Treatment: 9 Debridement Performed for Wound #1 Right,Lateral Lower Leg Assessment: Performed By: Physician Tristan Schroeder., MD Debridement: Open Wound/Selective Debridement Selective Description: Pre-procedure Yes Verification/Time Out Taken: Start Time: 08:43 Pain Control: Lidocaine 4% Topical Solution Level: Non-Viable Tissue Total Area Debrided (L x 1.3 (cm) x 1.3 (cm) = 1.69 (cm) W): Tissue and other Non-Viable, Eschar, Exudate, Fibrin/Slough material debrided: Instrument: Forceps Bleeding: None End Time: 08:45 Procedural Pain: 0 Post Procedural Pain: 0 Response to Treatment: Procedure was  tolerated well Post Debridement Measurements of Total Wound Length: (cm) 1.3 Width: (cm) 1.3 Depth: (cm) 0.1 Volume: (cm) 0.133 Post Procedure Diagnosis Same as Pre-procedure Electronic Signature(s) Signed: 07/19/2015 9:12:26 AM By: Evlyn Kanner MD, FACS Signed: 07/19/2015 4:22:28 PM By: Curtis Sites Entered By: Evlyn Kanner on 07/19/2015 09:12:26 [name] Kitchen ([id number]) -------------------------------------------------------------------------------- HPI Details Patient Name: [name], [name] Date of Service: 07/19/2015 8:00 AM Medical Record Number: [id number] Patient Account Number: [id number] Date of Birth/Sex: 1971-04-12 (44 y.o. Male) Treating RN: Curtis Sites Primary Care Physician: Mayford Knife, ERIC Other Clinician: Referring Physician: Mayford Knife, ERIC Treating Physician/Extender: Rudene Re in Treatment: 9 History of Present Illness Location: bilateral lower extremity swelling and ulceration Quality: Patient reports experiencing a dull pain to affected area(s). Severity: Patient states wound are getting worse. Duration: Patient has had the wound for > 3 months prior to seeking treatment at the wound center Timing: Pain in wound is Intermittent (comes and goes Context: The wound appeared gradually over time Modifying Factors: Other treatment(s) tried include: has seen his PCP and has been on Keflex. Associated Signs and Symptoms: Patient reports having difficulty standing for long periods. HPI Description: This 44 year old gentleman has been referred from his PCPs office where he was seen by Oscar Sheets, PA. The patient is known to be seen most recently for morbidly obese, sleep apnea, chronic lower extremity edema, osteoarthritis, pain in the legs. His past medical history is significant for chronic venous insufficiency, pain in the back, morbid obesity, cardiomegaly, osteoarthritis of the hip, sleep apnea, kidney stones, hematuria, ulcers both  lower extremities. He had a left hip arthroplasty in April of this year. He has never had venous duplex studies done on his lower extremities though he has been to the wound center several years ago. He has also not been wearing his compression stockings. 05/21/2015. He has been compliant with elevation of the limb and wearing his sleep apnea machine so that he can breathe better and try and get to sleep in a bed. His vascular studies as scheduled for  early August. 05/28/2015 -- he has his vascular appointment tomorrow afternoon and other than that has been doing very well. 06/04/2015 -- he had his vascular studies done last week and he goes back for them to be red and to get the opinion of the vascular surgeon. His insurance has not given him the Jobst stocking yet and he is otherwise doing fine. 06/11/2015 -- he did visit the vascular surgeons and though we don't have the official report the patient says that his study was complete and no surgical intervention was recommended the present time. He had compression stockings recommended for his venous hypertension and at some stage if he continued to have problems they would recommended lymphedema pumps. We will await official report regarding his vascular workup. In the meanwhile his wound is doing well and he has insurance approval for Apligraf and we will proceed with the first application of Apligraf today. 06/18/2015 -- Seen by Dr. Gilda Crease on 06/09/2015. Duplex ultrasound of the lower extremity showed normal deep venous system, superficial reflux was not present on the left and in a trivial segment on the right. Recommendations were for 20-30 mmHg compression stockings and at some stage later may be lymphedema pumps. 06/28/2015 -- he has done fine in this last week and is here for a second application of Apligraf. 07/05/2015 -- he is here for a wound check today and is anticipating he will be here next week prior to his [name]. ([id number]) right hip surgery on September 19 07/12/2015 -- unfortunately his orthopedic procedure was canceled and he is here to continue with wound care treatment till he has complete closure and he can reschedule his surgery. Electronic Signature(s) Signed: 07/19/2015 9:12:40 AM By: Evlyn Kanner MD, FACS Entered By: Evlyn Kanner on 07/19/2015 09:12:40 Kreamer, [name] ([id number]) -------------------------------------------------------------------------------- Physical Exam Details Patient Name: [name], [name] Date of Service: 07/19/2015 8:00 AM Medical Record Number: [id number] Patient Account Number: [id number] Date of Birth/Sex: Aug 13, 1971 (44 y.o. Male) Treating RN: Curtis Sites Primary Care Physician: Mayford Knife, ERIC Other Clinician: Referring Physician: Mayford Knife, ERIC Treating Physician/Extender: Rudene Re in Treatment: 9 Constitutional . Pulse regular. Respirations normal and unlabored. Afebrile. . Eyes Nonicteric. Reactive to light. Ears, Nose, Mouth, and Throat Lips, teeth, and gums WNL.Marland Kitchen Moist mucosa without lesions . Neck supple and nontender. No palpable supraclavicular or cervical adenopathy. Normal sized without goiter. Respiratory WNL. No retractions.. Cardiovascular Pedal Pulses WNL. No clubbing, cyanosis or edema. Chest Breasts symmetical and no nipple discharge.. Breast tissue WNL, no masses, lumps, or tenderness.. Lymphatic No adneopathy. No adenopathy. No adenopathy. Musculoskeletal Adexa without tenderness or enlargement.. Digits and nails w/o clubbing, cyanosis, infection, petechiae, ischemia, or inflammatory conditions.. Integumentary (Hair, Skin) No suspicious lesions. No crepitus or fluctuance. No peri-wound warmth or erythema. No masses.Marland Kitchen Psychiatric Judgement and insight Intact.. No evidence of depression, anxiety, or agitation.. Notes The wound has completely healed and there is no open ulceration Electronic  Signature(s) Signed: 07/19/2015 9:13:06 AM By: Evlyn Kanner MD, FACS Entered By: Evlyn Kanner on 07/19/2015 09:13:05 [place], [name] ([id number]) -------------------------------------------------------------------------------- Physician Orders Details Patient Name: Oscar Mirza C. Date of Service: 07/19/2015 8:00 AM Medical Record Number: [id number] Patient Account Number: [id number] Date of Birth/Sex: 05-Jul-1971 (44 y.o. Male) Treating RN: Curtis Sites Primary Care Physician: Mayford Knife, ERIC Other Clinician: Referring Physician: Mayford Knife, ERIC Treating Physician/Extender: Rudene Re in Treatment: 9 Verbal / Phone Orders: Yes Clinician: Curtis Sites Read Back and Verified: Yes Diagnosis Coding Wound Cleansing Wound #1 Right,Lateral Lower Leg o  Cleanse wound with mild soap and water Primary Wound Dressing o Promogran Secondary Dressing Wound #1 Right,Lateral Lower Leg o Dry Gauze Dressing Change Frequency Wound #1 Right,Lateral Lower Leg o Change dressing every week Follow-up Appointments Wound #1 Right,Lateral Lower Leg o Return Appointment in 1 week. Edema Control o Unna Boot to Right Lower Extremity Electronic Signature(s) Signed: 07/19/2015 3:44:09 PM By: Evlyn Kanner MD, FACS Signed: 07/19/2015 4:22:28 PM By: Curtis Sites Entered By: Curtis Sites on 07/19/2015 08:50:11 [place], [name] ([id number]) -------------------------------------------------------------------------------- Problem List Details Patient Name: [name], [name] Date of Service: 07/19/2015 8:00 AM Medical Record Number: [id number] Patient Account Number: [id number] Date of Birth/Sex: 1970-12-13 (44 y.o. Male) Treating RN: Curtis Sites Primary Care Physician: Mayford Knife, ERIC Other Clinician: Referring Physician: Mayford Knife, ERIC Treating Physician/Extender: Rudene Re in Treatment: 9 Active Problems ICD-10 Encounter Code Description Active  Date Diagnosis I87.313 Chronic venous hypertension (idiopathic) with ulcer of 05/14/2015 Yes bilateral lower extremity L97.212 Non-pressure chronic ulcer of right calf with fat layer 05/14/2015 Yes exposed L97.222 Non-pressure chronic ulcer of left calf with fat layer 05/14/2015 Yes exposed E66.01 Morbid (severe) obesity due to excess calories 05/14/2015 Yes I89.0 Lymphedema, not elsewhere classified 05/14/2015 Yes Inactive Problems Resolved Problems Electronic Signature(s) Signed: 07/19/2015 9:11:52 AM By: Evlyn Kanner MD, FACS Entered By: Evlyn Kanner on 07/19/2015 09:11:52 [place], [name] ([id number]) -------------------------------------------------------------------------------- Progress Note Details Patient Name: Oscar Mirza C. Date of Service: 07/19/2015 8:00 AM Medical Record Number: [id number] Patient Account Number: [id number] Date of Birth/Sex: 03/10/1971 (44 y.o. Male) Treating RN: Curtis Sites Primary Care Physician: Mayford Knife, ERIC Other Clinician: Referring Physician: Mayford Knife, ERIC Treating Physician/Extender: Rudene Re in Treatment: 9 Subjective Chief Complaint Information obtained from Patient Patient presents for treatment of an open ulcer due to venous insufficiency. 44 year old gentleman who has bilateral lower leg swelling and ulceration for the last 3 months. History of Present Illness (HPI) The following HPI elements were documented for the patient's wound: Location: bilateral lower extremity swelling and ulceration Quality: Patient reports experiencing a dull pain to affected area(s). Severity: Patient states wound are getting worse. Duration: Patient has had the wound for > 3 months prior to seeking treatment at the wound center Timing: Pain in wound is Intermittent (comes and goes Context: The wound appeared gradually over time Modifying Factors: Other treatment(s) tried include: has seen his PCP and has been on Keflex. Associated Signs and  Symptoms: Patient reports having difficulty standing for long periods. This 44 year old gentleman has been referred from his PCPs office where he was seen by Oscar Sheets, PA. The patient is known to be seen most recently for morbidly obese, sleep apnea, chronic lower extremity edema, osteoarthritis, pain in the legs. His past medical history is significant for chronic venous insufficiency, pain in the back, morbid obesity, cardiomegaly, osteoarthritis of the hip, sleep apnea, kidney stones, hematuria, ulcers both lower extremities. He had a left hip arthroplasty in April of this year. He has never had venous duplex studies done on his lower extremities though he has been to the wound center several years ago. He has also not been wearing his compression stockings. 05/21/2015. He has been compliant with elevation of the limb and wearing his sleep apnea machine so that he can breathe better and try and get to sleep in a bed. His vascular studies as scheduled for early August. 05/28/2015 -- he has his vascular appointment tomorrow afternoon and other than that has been doing very well. 06/04/2015 -- he had his vascular studies done last week  and he goes back for them to be red and to get the opinion of the vascular surgeon. His insurance has not given him the Jobst stocking yet and he is otherwise doing fine. 06/11/2015 -- he did visit the vascular surgeons and though we don't have the official report the patient says that his study was complete and no surgical intervention was recommended the present time. He had compression stockings recommended for his venous hypertension and at some stage if he continued to have problems they would recommended lymphedema pumps. We will await official report regarding his vascular workup. [name], [name] ([id number]) In the meanwhile his wound is doing well and he has insurance approval for Apligraf and we will proceed with the first application of Apligraf  today. 06/18/2015 -- Seen by Dr. Gilda Crease on 06/09/2015. Duplex ultrasound of the lower extremity showed normal deep venous system, superficial reflux was not present on the left and in a trivial segment on the right. Recommendations were for 20-30 mmHg compression stockings and at some stage later may be lymphedema pumps. 06/28/2015 -- he has done fine in this last week and is here for a second application of Apligraf. 07/05/2015 -- he is here for a wound check today and is anticipating he will be here next week prior to his right hip surgery on September 19 07/12/2015 -- unfortunately his orthopedic procedure was canceled and he is here to continue with wound care treatment till he has complete closure and he can reschedule his surgery. Objective Constitutional Pulse regular. Respirations normal and unlabored. Afebrile. Vitals Time Taken: 8:15 AM, Height: 72 in, Weight: 386 lbs, BMI: 52.3, Pulse: 80 bpm, Respiratory Rate: 20 breaths/min, Blood Pressure: 141/71 mmHg. Eyes Nonicteric. Reactive to light. Ears, Nose, Mouth, and Throat Lips, teeth, and gums WNL.Marland Kitchen Moist mucosa without lesions . Neck supple and nontender. No palpable supraclavicular or cervical adenopathy. Normal sized without goiter. Respiratory WNL. No retractions.. Cardiovascular Pedal Pulses WNL. No clubbing, cyanosis or edema. Chest Breasts symmetical and no nipple discharge.. Breast tissue WNL, no masses, lumps, or tenderness.. Lymphatic No adneopathy. No adenopathy. No adenopathy. Musculoskeletal Adexa without tenderness or enlargement.. Digits and nails w/o clubbing, cyanosis, infection, petechiae, ischemia, or inflammatory conditions.Marland Kitchen [name], [name] ([id number]) Psychiatric Judgement and insight Intact.. No evidence of depression, anxiety, or agitation.. General Notes: The wound has completely healed and there is no open ulceration Integumentary (Hair, Skin) No suspicious lesions. No crepitus or  fluctuance. No peri-wound warmth or erythema. No masses.. Wound #1 status is Open. Original cause of wound was Gradually Appeared. The wound is located on the Right,Lateral Lower Leg. The wound measures 1.3cm length x 1.3cm width x 0.1cm depth; 1.327cm^2 area and 0.133cm^3 volume. The wound is limited to skin breakdown. There is no tunneling or undermining noted. There is a medium amount of serous drainage noted. The wound margin is indistinct and nonvisible. There is no granulation within the wound bed. There is a large (67-100%) amount of necrotic tissue within the wound bed including Eschar. The periwound skin appearance exhibited: Localized Edema, Moist. The periwound skin appearance did not exhibit: Maceration. Periwound temperature was noted as No Abnormality. Assessment Active Problems ICD-10 I87.313 - Chronic venous hypertension (idiopathic) with ulcer of bilateral lower extremity L97.212 - Non-pressure chronic ulcer of right calf with fat layer exposed L97.222 - Non-pressure chronic ulcer of left calf with fat layer exposed E66.01 - Morbid (severe) obesity due to excess calories I89.0 - Lymphedema, not elsewhere classified Having confirmed that the wound is completely  healed I will put a piece of collagen and a light bolster and then use the Unna's boots for another week. I was asked him to bring his compression stockings the next time he comes around and if all is well we will discharge him from the wound care services next week. Procedures Wound #1 Wound #1 is a Venous Leg Ulcer located on the Right,Lateral Lower Leg . There was a Non-Viable Tissue Open Wound/Selective [phone]) debridement with total area of 1.69 sq cm performed by Britto, Ignacia Felling., MD. with the following instrument(s): Forceps to remove Non-Viable tissue/material including Exudate, Fibrin/Slough, and Eschar after achieving pain control using Lidocaine 4% Topical Solution. A time out was [name], [name]  ([id number]) conducted prior to the start of the procedure. There was no bleeding. The procedure was tolerated well with a pain level of 0 throughout and a pain level of 0 following the procedure. Post Debridement Measurements: 1.3cm length x 1.3cm width x 0.1cm depth; 0.133cm^3 volume. Post procedure Diagnosis Wound #1: Same as Pre-Procedure Plan Wound Cleansing: Wound #1 Right,Lateral Lower Leg: Cleanse wound with mild soap and water Primary Wound Dressing: Promogran Secondary Dressing: Wound #1 Right,Lateral Lower Leg: Dry Gauze Dressing Change Frequency: Wound #1 Right,Lateral Lower Leg: Change dressing every week Follow-up Appointments: Wound #1 Right,Lateral Lower Leg: Return Appointment in 1 week. Edema Control: Unna Boot to Right Lower Extremity Having confirmed that the wound is completely healed I will put a piece of collagen and a light bolster and then use the Unna's boots for another week. I was asked him to bring his compression stockings the next time he comes around and if all is well we will discharge him from the wound care services next week. Electronic Signature(s) Signed: 07/19/2015 9:13:54 AM By: Evlyn Kanner MD, FACS Entered By: Evlyn Kanner on 07/19/2015 09:13:53 [name], [name] ([id number]) -------------------------------------------------------------------------------- SuperBill Details Patient Name: Oscar Mirza C. Date of Service: 07/19/2015 Medical Record Number: [id number] Patient Account Number: [id number] Date of Birth/Sex: [birth year] (44 y.o. Male) Treating RN: Curtis Sites Primary Care Physician: Mayford Knife, ERIC Other Clinician: Referring Physician: Mayford Knife, ERIC Treating Physician/Extender: Rudene Re in Treatment: 9 Diagnosis Coding ICD-10 Codes Code Description I87.313 Chronic venous hypertension (idiopathic) with ulcer of bilateral lower extremity L97.212 Non-pressure chronic ulcer of right calf with fat layer  exposed L97.222 Non-pressure chronic ulcer of left calf with fat layer exposed E66.01 Morbid (severe) obesity due to excess calories I89.0 Lymphedema, not elsewhere classified Facility Procedures CPT4: Description Modifier Quantity Code [id number] - DEBRIDE WOUND 1ST 20 SQ CM OR < 1 ICD-10 Description Diagnosis L97.212 Non-pressure chronic ulcer of right calf with fat layer exposed I87.313 Chronic venous hypertension (idiopathic) with ulcer  of bilateral lower extremity Physician Procedures CPT4: Description Modifier Quantity Code [id number] - WC PHYS DEBR WO ANESTH 20 SQ CM 1 ICD-10 Description Diagnosis L97.212 Non-pressure chronic ulcer of right calf with fat layer exposed I87.313 Chronic venous hypertension (idiopathic) with ulcer of  bilateral lower extremity Electronic Signature(s) Signed: 07/19/2015 9:14:47 AM By: Evlyn Kanner MD, FACS Previous Signature: 07/19/2015 9:14:09 AM Version By: Evlyn Kanner MD, FACS Entered By: Evlyn Kanner on 07/19/2015 09:14:46

## 2015-07-20 NOTE — Progress Notes (Signed)
MUNACHIMSO, PALIN ([id number]) Visit Report for 07/19/2015 Arrival Information Details Patient Name: [name], [name]. Date of Service: 07/19/2015 8:00 AM Medical Record Number: [id number] Patient Account Number: [id number] Date of Birth/Sex: 06-01-1971 (44 y.o. Male) Treating RN: Curtis Sites Primary Care Physician: Mayford Knife, ERIC Other Clinician: Referring Physician: Mayford Knife, ERIC Treating Physician/Extender: Rudene Re in Treatment: 9 Visit Information History Since Last Visit Added or deleted any medications: No Patient Arrived: Ambulatory Any new allergies or adverse reactions: No Arrival Time: 08:13 Had a fall or experienced change in No Accompanied By: spouse activities of daily living that may affect Transfer Assistance: None risk of falls: Patient Identification Verified: Yes Signs or symptoms of abuse/neglect since last No Secondary Verification Process Yes visito Completed: Hospitalized since last visit: No Patient Has Alerts: Yes Pain Present Now: No Patient Alerts: Patient on Blood Thinner Electronic Signature(s) Signed: 07/19/2015 4:22:28 PM By: Curtis Sites Entered By: Curtis Sites on 07/19/2015 08:14:06 [name], [name] ([id number]) -------------------------------------------------------------------------------- Encounter Discharge Information Details Patient Name: Oscar Mirza C. Date of Service: 07/19/2015 8:00 AM Medical Record Number: [id number] Patient Account Number: [id number] Date of Birth/Sex: [birth year] (44 y.o. Male) Treating RN: Curtis Sites Primary Care Physician: Mayford Knife, ERIC Other Clinician: Referring Physician: Mayford Knife, ERIC Treating Physician/Extender: Rudene Re in Treatment: 9 Encounter Discharge Information Items Discharge Pain Level: 0 Discharge Condition: Stable Ambulatory Status: Ambulatory Discharge Destination: Home Transportation: Private Auto Accompanied By: spouse Schedule Follow-up Appointment:  Yes Medication Reconciliation completed and provided to Patient/Care No Sheldon Amara: Provided on Clinical Summary of Care: 07/19/2015 Form Type Recipient Paper Patient EM Electronic Signature(s) Signed: 07/19/2015 9:01:42 AM By: Gwenlyn Perking Entered By: Gwenlyn Perking on 07/19/2015 09:01:42 Norden, Lavel C. ([id number]) -------------------------------------------------------------------------------- Lower Extremity Assessment Details Patient Name: [name], [name] Date of Service: 07/19/2015 8:00 AM Medical Record Number: [id number] Patient Account Number: [id number] Date of Birth/Sex: 01-18-1971 (44 y.o. Male) Treating RN: Curtis Sites Primary Care Physician: Mayford Knife, ERIC Other Clinician: Referring Physician: Mayford Knife, ERIC Treating Physician/Extender: Rudene Re in Treatment: 9 Edema Assessment Assessed: [Left: No] [Right: No] Edema: [Left: Ye] [Right: s] Calf Left: Right: Point of Measurement: 33 cm From Medial Instep cm 44.4 cm Ankle Left: Right: Point of Measurement: 11 cm From Medial Instep cm 28 cm Vascular Assessment Pulses: Posterior Tibial Dorsalis Pedis Palpable: [Right:Yes] Extremity colors, hair growth, and conditions: Extremity Color: [Right:Hyperpigmented] Hair Growth on Extremity: [Right:No] Temperature of Extremity: [Right:Warm] Capillary Refill: [Right:< 3 seconds] Toe Nail Assessment Left: Right: Thick: Yes Discolored: No Deformed: No Improper Length and Hygiene: Yes Electronic Signature(s) Signed: 07/19/2015 4:22:28 PM By: Curtis Sites Entered By: Curtis Sites on 07/19/2015 08:21:58 [name], Oscar CMarland Kitchen ([id number]) -------------------------------------------------------------------------------- Multi Wound Chart Details Patient Name: [name], [name] Date of Service: 07/19/2015 8:00 AM Medical Record Number: [id number] Patient Account Number: [id number] Date of Birth/Sex: 01/12/1971 (44 y.o. Male) Treating RN: Curtis Sites Primary Care Physician: Mayford Knife, ERIC Other Clinician: Referring Physician: Mayford Knife, ERIC Treating Physician/Extender: Rudene Re in Treatment: 9 Vital Signs Height(in): 72 Pulse(bpm): 80 Weight(lbs): 386 Blood Pressure 141/71 (mmHg): Body Mass Index(BMI): 52 Temperature(F): Respiratory Rate 20 (breaths/min): Photos: [1:No Photos] [N/A:N/A] Wound Location: [1:Right Lower Leg - Lateral N/A] Wounding Event: [1:Gradually Appeared] [N/A:N/A] Primary Etiology: [1:Venous Leg Ulcer] [N/A:N/A] Comorbid History: [1:Sleep Apnea, Rheumatoid N/A Arthritis] Date Acquired: [1:01/26/2015] [N/A:N/A] Weeks of Treatment: [1:9] [N/A:N/A] Wound Status: [1:Open] [N/A:N/A] Measurements L x W x D 1.3x1.3x0.1 [N/A:N/A] (cm) Area (cm) : [1:1.327] [N/A:N/A] Volume (cm) : [1:0.133] [N/A:N/A] % Reduction in Area: [1:86.60%] [N/A:N/A] % Reduction in Volume:  86.60% [N/A:N/A] Classification: [1:Full Thickness Without Exposed Support Structures] [N/A:N/A] Exudate Amount: [1:Medium] [N/A:N/A] Exudate Type: [1:Serous] [N/A:N/A] Exudate Color: [1:amber] [N/A:N/A] Wound Margin: [1:Indistinct, nonvisible] [N/A:N/A] Granulation Amount: [1:None Present (0%)] [N/A:N/A] Necrotic Amount: [1:Large (67-100%)] [N/A:N/A] Necrotic Tissue: [1:Eschar] [N/A:N/A] Exposed Structures: [1:Fascia: No Fat: No Tendon: No Muscle: No Joint: No] [N/A:N/A] Bone: No Limited to Skin Breakdown Epithelialization: Small (1-33%) N/A N/A Periwound Skin Texture: Edema: Yes N/A N/A Periwound Skin Moist: Yes N/A N/A Moisture: Maceration: No Periwound Skin Color: No Abnormalities Noted N/A N/A Temperature: No Abnormality N/A N/A Tenderness on No N/A N/A Palpation: Wound Preparation: Ulcer Cleansing: N/A N/A Rinsed/Irrigated with Saline, Other: water and soap Topical Anesthetic Applied: Other: lidocaine 4% Treatment Notes Electronic Signature(s) Signed: 07/19/2015 4:22:28 PM By: Curtis Sites Entered By:  Curtis Sites on 07/19/2015 08:24:47 [name], [name] ([id number]) -------------------------------------------------------------------------------- Multi-Disciplinary Care Plan Details Patient Name: Oscar Mirza C. Date of Service: 07/19/2015 8:00 AM Medical Record Number: [id number] Patient Account Number: [id number] Date of Birth/Sex: 07-09-1971 (44 y.o. Male) Treating RN: Curtis Sites Primary Care Physician: Mayford Knife, ERIC Other Clinician: Referring Physician: Mayford Knife, ERIC Treating Physician/Extender: Rudene Re in Treatment: 9 Active Inactive Orientation to the Wound Care Program Nursing Diagnoses: Knowledge deficit related to the wound healing center program Goals: Patient/caregiver will verbalize understanding of the Wound Healing Center Program Date Initiated: 05/14/2015 Goal Status: Active Interventions: Provide education on orientation to the wound center Notes: Venous Leg Ulcer Nursing Diagnoses: Potential for venous Insuffiency (use before diagnosis confirmed) Goals: Non-invasive venous studies are completed as ordered Date Initiated: 05/14/2015 Goal Status: Active Interventions: Assess peripheral edema status every visit. Treatment Activities: Non-invasive vascular studies : 07/19/2015 Notes: Wound/Skin Impairment Nursing Diagnoses: Impaired tissue integrity [name], [name] ([id number]) Goals: Patient/caregiver will verbalize understanding of skin care regimen Date Initiated: 05/14/2015 Goal Status: Active Ulcer/skin breakdown will heal within 14 weeks Date Initiated: 05/14/2015 Goal Status: Active Interventions: Assess patient/caregiver ability to obtain necessary supplies Notes: Electronic Signature(s) Signed: 07/19/2015 4:22:28 PM By: Curtis Sites Entered By: Curtis Sites on 07/19/2015 08:24:28 [name], [name] ([id number]) -------------------------------------------------------------------------------- Patient/Caregiver Education  Details Patient Name: Oscar Civil. Date of Service: 07/19/2015 8:00 AM Medical Record Number: [id number] Patient Account Number: [id number] Date of Birth/Gender: [birth year] (44 y.o. Male) Treating RN: Curtis Sites Primary Care Physician: Mayford Knife, ERIC Other Clinician: Referring Physician: Mayford Knife, ERIC Treating Physician/Extender: Rudene Re in Treatment: 9 Education Assessment Education Provided To: Patient and Caregiver Education Topics Provided Venous: Handouts: Other: bring compression garments next week Methods: Explain/Verbal Responses: State content correctly Electronic Signature(s) Signed: 07/19/2015 4:22:28 PM By: Curtis Sites Entered By: Curtis Sites on 07/19/2015 09:01:57 [name], [name] ([id number]) -------------------------------------------------------------------------------- Wound Assessment Details Patient Name: [name], [name] Date of Service: 07/19/2015 8:00 AM Medical Record Number: [id number] Patient Account Number: [id number] Date of Birth/Sex: 01-Aug-1971 (44 y.o. Male) Treating RN: Curtis Sites Primary Care Physician: Mayford Knife, ERIC Other Clinician: Referring Physician: Mayford Knife, ERIC Treating Physician/Extender: Rudene Re in Treatment: 9 Wound Status Wound Number: 1 Primary Etiology: Venous Leg Ulcer Wound Location: Right Lower Leg - Lateral Wound Status: Open Wounding Event: Gradually Appeared Comorbid Sleep Apnea, Rheumatoid History: Arthritis Date Acquired: 01/26/2015 Weeks Of Treatment: 9 Clustered Wound: No Photos Photo Uploaded By: Curtis Sites on 07/19/2015 09:24:19 Wound Measurements Length: (cm) 1.3 Width: (cm) 1.3 Depth: (cm) 0.1 Area: (cm) 1.327 Volume: (cm) 0.133 % Reduction in Area: 86.6% % Reduction in Volume: 86.6% Epithelialization: Small (1-33%) Tunneling: No Undermining: No Wound Description Full Thickness Without Exposed Foul Odor After Classification: Support Structures  Wound  Margin: Indistinct, nonvisible Exudate Medium Amount: Exudate Type: Serous Exudate Color: amber Cleansing: No Wound Bed Granulation Amount: None Present (0%) Exposed Structure Necrotic Amount: Large (67-100%) Fascia Exposed: No Necrotic Quality: Eschar Fat Layer Exposed: No Velazquez, Zohan C. ([id number]) Tendon Exposed: No Muscle Exposed: No Joint Exposed: No Bone Exposed: No Limited to Skin Breakdown Periwound Skin Texture Texture Color No Abnormalities Noted: No No Abnormalities Noted: No Localized Edema: Yes Temperature / Pain Moisture Temperature: No Abnormality No Abnormalities Noted: No Maceration: No Moist: Yes Wound Preparation Ulcer Cleansing: Rinsed/Irrigated with Saline, Other: water and soap, Topical Anesthetic Applied: Other: lidocaine 4%, Treatment Notes Wound #1 (Right, Lateral Lower Leg) 1. Cleansed with: Clean wound with Normal Saline Cleanse wound with antibacterial soap and water 2. Anesthetic Topical Lidocaine 4% cream to wound bed prior to debridement 4. Dressing Applied: Promogran 5. Secondary Dressing Applied Dry Gauze 7. Secured with Henriette Combs to Right Lower Extremity Electronic Signature(s) Signed: 07/19/2015 4:22:28 PM By: Curtis Sites Entered By: Curtis Sites on 07/19/2015 08:24:20 Sharma, [name] ([id number]) -------------------------------------------------------------------------------- Vitals Details Patient Name: Oscar Mirza C. Date of Service: 07/19/2015 8:00 AM Medical Record Number: [id number] Patient Account Number: [id number] Date of Birth/Sex: 05-21-1971 (44 y.o. Male) Treating RN: Curtis Sites Primary Care Physician: Mayford Knife, ERIC Other Clinician: Referring Physician: Mayford Knife, ERIC Treating Physician/Extender: Rudene Re in Treatment: 9 Vital Signs Time Taken: 08:15 Pulse (bpm): 80 Height (in): 72 Respiratory Rate (breaths/min): 20 Weight (lbs): 386 Blood Pressure (mmHg): 141/71 Body Mass  Index (BMI): 52.3 Reference Range: 80 - 120 mg / dl Electronic Signature(s) Signed: 07/19/2015 4:22:28 PM By: Curtis Sites Entered By: Curtis Sites on 07/19/2015 08:17:21

## 2015-07-26 ENCOUNTER — Encounter: Payer: No Typology Code available for payment source | Admitting: Surgery

## 2015-07-26 DIAGNOSIS — L97212 Non-pressure chronic ulcer of right calf with fat layer exposed: Secondary | ICD-10-CM | POA: Diagnosis not present

## 2015-07-27 NOTE — Progress Notes (Signed)
RAYNEN, FARBMAN ([id number]) Visit Report for 07/26/2015 Chief Complaint Document Details Patient Name: Oscar, [name]. Date of Service: 07/26/2015 8:00 AM Medical Record Number: [id number] Patient Account Number: [id number] Date of Birth/Sex: [birth year] (44 y.o. Male) Treating RN: Curtis Sites Primary Care Physician: Mayford Knife, ERIC Other Clinician: Referring Physician: Mayford Knife, ERIC Treating Physician/Extender: Rudene Re in Treatment: 10 Information Obtained from: Patient Chief Complaint Patient presents for treatment of an open ulcer due to venous insufficiency. 44 year old gentleman who has bilateral lower leg swelling and ulceration for the last 3 months. Electronic Signature(s) Signed: 07/26/2015 8:42:29 AM By: Evlyn Kanner MD, FACS Entered By: Evlyn Kanner on 07/26/2015 08:42:29 [name], [name] ([id number]) -------------------------------------------------------------------------------- HPI Details Patient Name: Oscar Mirza C. Date of Service: 07/26/2015 8:00 AM Medical Record Number: [id number] Patient Account Number: [id number] Date of Birth/Sex: [birth year] (44 y.o. Male) Treating RN: Curtis Sites Primary Care Physician: Mayford Knife, ERIC Other Clinician: Referring Physician: Mayford Knife, ERIC Treating Physician/Extender: Rudene Re in Treatment: 10 History of Present Illness Location: bilateral lower extremity swelling and ulceration Quality: Patient reports experiencing a dull pain to affected area(s). Severity: Patient states wound are getting worse. Duration: Patient has had the wound for > 3 months prior to seeking treatment at the wound center Timing: Pain in wound is Intermittent (comes and goes Context: The wound appeared gradually over time Modifying Factors: Other treatment(s) tried include: has seen his PCP and has been on Keflex. Associated Signs and Symptoms: Patient reports having difficulty standing for long periods. HPI Description:  This 44 year old gentleman has been referred from his PCPs office where he was seen by Esperanza Sheets, PA. The patient is known to be seen most recently for morbidly obese, sleep apnea, chronic lower extremity edema, osteoarthritis, pain in the legs. His past medical history is significant for chronic venous insufficiency, pain in the back, morbid obesity, cardiomegaly, osteoarthritis of the hip, sleep apnea, kidney stones, hematuria, ulcers both lower extremities. He had a left hip arthroplasty in April of this year. He has never had venous duplex studies done on his lower extremities though he has been to the wound center several years ago. He has also not been wearing his compression stockings. 05/21/2015. He has been compliant with elevation of the limb and wearing his sleep apnea machine so that he can breathe better and try and get to sleep in a bed. His vascular studies as scheduled for early August. 05/28/2015 -- he has his vascular appointment tomorrow afternoon and other than that has been doing very well. 06/04/2015 -- he had his vascular studies done last week and he goes back for them to be red and to get the opinion of the vascular surgeon. His insurance has not given him the Jobst stocking yet and he is otherwise doing fine. 06/11/2015 -- he did visit the vascular surgeons and though we don't have the official report the patient says that his study was complete and no surgical intervention was recommended the present time. He had compression stockings recommended for his venous hypertension and at some stage if he continued to have problems they would recommended lymphedema pumps. We will await official report regarding his vascular workup. In the meanwhile his wound is doing well and he has insurance approval for Apligraf and we will proceed with the first application of Apligraf today. 06/18/2015 -- Seen by Dr. Gilda Crease on 06/09/2015. Duplex ultrasound of the lower extremity  showed normal deep venous system, superficial reflux was not present on the left and in a trivial segment  on the right. Recommendations were for 20-30 mmHg compression stockings and at some stage later may be lymphedema pumps. 06/28/2015 -- he has done fine in this last week and is here for a second application of Apligraf. 07/05/2015 -- he is here for a wound check today and is anticipating he will be here next week prior to his RICHIE, BONANNO. ([id number]) right hip surgery on September 19 07/12/2015 -- unfortunately his orthopedic procedure was canceled and he is here to continue with wound care treatment till he has complete closure and he can reschedule his surgery. Electronic Signature(s) Signed: 07/26/2015 8:42:36 AM By: Evlyn Kanner MD, FACS Entered By: Evlyn Kanner on 07/26/2015 08:42:36 [name], [name] ([id number]) -------------------------------------------------------------------------------- Physical Exam Details Patient Name: [name], [name] Date of Service: 07/26/2015 8:00 AM Medical Record Number: [id number] Patient Account Number: [id number] Date of Birth/Sex: Jun 12, 1971 (44 y.o. Male) Treating RN: Curtis Sites Primary Care Physician: Mayford Knife, ERIC Other Clinician: Referring Physician: Mayford Knife, ERIC Treating Physician/Extender: Rudene Re in Treatment: 10 Constitutional . Pulse regular. Respirations normal and unlabored. Afebrile. . Eyes Nonicteric. Reactive to light. Ears, Nose, Mouth, and Throat Lips, teeth, and gums WNL.Marland Kitchen Moist mucosa without lesions . Neck supple and nontender. No palpable supraclavicular or cervical adenopathy. Normal sized without goiter. Respiratory WNL. No retractions.. Breath sounds WNL, No rubs, rales, rhonchi, or wheeze.. Cardiovascular Heart rhythm and rate regular, no murmur or gallop.. Pedal Pulses WNL. No clubbing, cyanosis or edema. Chest Breasts symmetical and no nipple discharge.. Breast tissue WNL, no masses,  lumps, or tenderness.. Lymphatic No adneopathy. No adenopathy. No adenopathy. Musculoskeletal Adexa without tenderness or enlargement.. Digits and nails w/o clubbing, cyanosis, infection, petechiae, ischemia, or inflammatory conditions.. Integumentary (Hair, Skin) No suspicious lesions. No crepitus or fluctuance. No peri-wound warmth or erythema. No masses.Marland Kitchen Psychiatric Judgement and insight Intact.. No evidence of depression, anxiety, or agitation.. Notes the wound on the right lateral ankle region has completely healed and the skin is supple and looks excellent. Electronic Signature(s) Signed: 07/26/2015 8:44:05 AM By: Evlyn Kanner MD, FACS Entered By: Evlyn Kanner on 07/26/2015 08:44:04 [name], [name] ([id number]) -------------------------------------------------------------------------------- Physician Orders Details Patient Name: Oscar Mirza C. Date of Service: 07/26/2015 8:00 AM Medical Record Number: [id number] Patient Account Number: [id number] Date of Birth/Sex: 08-10-1971 (44 y.o. Male) Treating RN: Curtis Sites Primary Care Physician: Mayford Knife, ERIC Other Clinician: Referring Physician: Mayford Knife, ERIC Treating Physician/Extender: Rudene Re in Treatment: 10 Verbal / Phone Orders: Yes Clinician: Curtis Sites Read Back and Verified: Yes Diagnosis Coding Discharge From [facility] Services o Discharge from Wound Care Center Electronic Signature(s) Signed: 07/26/2015 5:16:51 PM By: Evlyn Kanner MD, FACS Signed: 07/26/2015 5:17:19 PM By: Curtis Sites Entered By: Curtis Sites on 07/26/2015 08:38:23 [name], [name] ([id number]) -------------------------------------------------------------------------------- Problem List Details Patient Name: Aispuro, Dilyn C. Date of Service: 07/26/2015 8:00 AM Medical Record Number: [id number] Patient Account Number: [id number] Date of Birth/Sex: Mar 10, 1971 (44 y.o. Male) Treating RN: Curtis Sites Primary Care  Physician: Mayford Knife, ERIC Other Clinician: Referring Physician: Mayford Knife, ERIC Treating Physician/Extender: Rudene Re in Treatment: 10 Active Problems ICD-10 Encounter Code Description Active Date Diagnosis I87.313 Chronic venous hypertension (idiopathic) with ulcer of 05/14/2015 Yes bilateral lower extremity L97.212 Non-pressure chronic ulcer of right calf with fat layer 05/14/2015 Yes exposed L97.222 Non-pressure chronic ulcer of left calf with fat layer 05/14/2015 Yes exposed E66.01 Morbid (severe) obesity due to excess calories 05/14/2015 Yes I89.0 Lymphedema, not elsewhere classified 05/14/2015 Yes Inactive Problems Resolved Problems Electronic Signature(s) Signed: 07/26/2015 8:42:05 AM By: Evlyn Kanner  MD, FACS Entered By: Evlyn Kanner on 07/26/2015 08:42:04 [name], [name] ([id number]) -------------------------------------------------------------------------------- Progress Note Details Patient Name: [name], [name] Date of Service: 07/26/2015 8:00 AM Medical Record Number: [id number] Patient Account Number: [id number] Date of Birth/Sex: 07/14/1971 (44 y.o. Male) Treating RN: Curtis Sites Primary Care Physician: Mayford Knife, ERIC Other Clinician: Referring Physician: Mayford Knife, ERIC Treating Physician/Extender: Rudene Re in Treatment: 10 Subjective Chief Complaint Information obtained from Patient Patient presents for treatment of an open ulcer due to venous insufficiency. 44 year old gentleman who has bilateral lower leg swelling and ulceration for the last 3 months. History of Present Illness (HPI) The following HPI elements were documented for the patient's wound: Location: bilateral lower extremity swelling and ulceration Quality: Patient reports experiencing a dull pain to affected area(s). Severity: Patient states wound are getting worse. Duration: Patient has had the wound for > 3 months prior to seeking treatment at the wound center Timing: Pain  in wound is Intermittent (comes and goes Context: The wound appeared gradually over time Modifying Factors: Other treatment(s) tried include: has seen his PCP and has been on Keflex. Associated Signs and Symptoms: Patient reports having difficulty standing for long periods. This 44 year old gentleman has been referred from his PCPs office where he was seen by Esperanza Sheets, PA. The patient is known to be seen most recently for morbidly obese, sleep apnea, chronic lower extremity edema, osteoarthritis, pain in the legs. His past medical history is significant for chronic venous insufficiency, pain in the back, morbid obesity, cardiomegaly, osteoarthritis of the hip, sleep apnea, kidney stones, hematuria, ulcers both lower extremities. He had a left hip arthroplasty in April of this year. He has never had venous duplex studies done on his lower extremities though he has been to the wound center several years ago. He has also not been wearing his compression stockings. 05/21/2015. He has been compliant with elevation of the limb and wearing his sleep apnea machine so that he can breathe better and try and get to sleep in a bed. His vascular studies as scheduled for early August. 05/28/2015 -- he has his vascular appointment tomorrow afternoon and other than that has been doing very well. 06/04/2015 -- he had his vascular studies done last week and he goes back for them to be red and to get the opinion of the vascular surgeon. His insurance has not given him the Jobst stocking yet and he is otherwise doing fine. 06/11/2015 -- he did visit the vascular surgeons and though we don't have the official report the patient says that his study was complete and no surgical intervention was recommended the present time. He had compression stockings recommended for his venous hypertension and at some stage if he continued to have problems they would recommended lymphedema pumps. We will await official report  regarding his vascular workup. [name], [name] ([id number]) In the meanwhile his wound is doing well and he has insurance approval for Apligraf and we will proceed with the first application of Apligraf today. 06/18/2015 -- Seen by Dr. Gilda Crease on 06/09/2015. Duplex ultrasound of the lower extremity showed normal deep venous system, superficial reflux was not present on the left and in a trivial segment on the right. Recommendations were for 20-30 mmHg compression stockings and at some stage later may be lymphedema pumps. 06/28/2015 -- he has done fine in this last week and is here for a second application of Apligraf. 07/05/2015 -- he is here for a wound check today and is anticipating he will be here  next week prior to his right hip surgery on September 19 07/12/2015 -- unfortunately his orthopedic procedure was canceled and he is here to continue with wound care treatment till he has complete closure and he can reschedule his surgery. Objective Constitutional Pulse regular. Respirations normal and unlabored. Afebrile. Vitals Time Taken: 8:16 AM, Height: 72 in, Weight: 386 lbs, BMI: 52.3, Temperature: 98.3 F, Pulse: 85 bpm, Respiratory Rate: 20 breaths/min, Blood Pressure: 145/66 mmHg. Eyes Nonicteric. Reactive to light. Ears, Nose, Mouth, and Throat Lips, teeth, and gums WNL.Marland Kitchen Moist mucosa without lesions . Neck supple and nontender. No palpable supraclavicular or cervical adenopathy. Normal sized without goiter. Respiratory WNL. No retractions.. Breath sounds WNL, No rubs, rales, rhonchi, or wheeze.. Cardiovascular Heart rhythm and rate regular, no murmur or gallop.. Pedal Pulses WNL. No clubbing, cyanosis or edema. Chest Breasts symmetical and no nipple discharge.. Breast tissue WNL, no masses, lumps, or tenderness.. Lymphatic No adneopathy. No adenopathy. No adenopathy. Musculoskeletal Adexa without tenderness or enlargement.. Digits and nails w/o clubbing, cyanosis,  infection, petechiae, ischemia, or inflammatory conditions.Marland Kitchen [name], [name] ([id number]) Psychiatric Judgement and insight Intact.. No evidence of depression, anxiety, or agitation.. General Notes: the wound on the right lateral ankle region has completely healed and the skin is supple and looks excellent. Integumentary (Hair, Skin) No suspicious lesions. No crepitus or fluctuance. No peri-wound warmth or erythema. No masses.. Wound #1 status is Healed - Epithelialized. Original cause of wound was Gradually Appeared. The wound is located on the Right,Lateral Lower Leg. The wound measures 0cm length x 0cm width x 0cm depth; 0cm^2 area and 0cm^3 volume. Assessment Active Problems ICD-10 I87.313 - Chronic venous hypertension (idiopathic) with ulcer of bilateral lower extremity L97.212 - Non-pressure chronic ulcer of right calf with fat layer exposed L97.222 - Non-pressure chronic ulcer of left calf with fat layer exposed E66.01 - Morbid (severe) obesity due to excess calories I89.0 - Lymphedema, not elsewhere classified The patient's wound has completely healed and he can go back home with his compression stockings. We have discussed wound care, management of his compression stockings and the need for regular use of his stockings even though his wound is completely healed. He is cleared to go ahead with his orthopedic surgery as soon as possible. He is discharged from the wound care services and will be seen back as needed Plan [facility]charge From [facility]: Discharge from [facility] [place], [place] C. ([id number]) The patient's wound has completely healed and he can go back home with his compression stockings. We have discussed wound care, management of his compression stockings and the need for regular use of his stockings even though his wound is completely healed. He is cleared to go ahead with his orthopedic surgery as soon as possible. He is discharged from the wound care  services and will be seen back as needed Electronic Signature(s) Signed: 07/26/2015 8:45:17 AM By: Evlyn Kanner MD, FACS Entered By: Evlyn Kanner on 07/26/2015 08:45:17 [name], [name] ([id number]) -------------------------------------------------------------------------------- SuperBill Details Patient Name: Oscar Mirza C. Date of Service: 07/26/2015 Medical Record Number: [id number] Patient Account Number: [id number] Date of Birth/Sex: Aug 05, 1971 (44 y.o. Male) Treating RN: Curtis Sites Primary Care Physician: Mayford Knife, ERIC Other Clinician: Referring Physician: Mayford Knife, ERIC Treating Physician/Extender: Rudene Re in Treatment: 10 Diagnosis Coding ICD-10 Codes Code Description I87.313 Chronic venous hypertension (idiopathic) with ulcer of bilateral lower extremity L97.212 Non-pressure chronic ulcer of right calf with fat layer exposed L97.222 Non-pressure chronic ulcer of left calf with fat layer exposed E66.01 Morbid (  severe) obesity due to excess calories I89.0 Lymphedema, not elsewhere classified Facility Procedures CPT4 Code: [id number] Description: [phone] - WOUND CARE VISIT-LEV 2 EST PT Modifier: Quantity: 1 Physician Procedures CPT4: Description Modifier Quantity Code [id number] - WC PHYS LEVEL 3 - EST PT 1 ICD-10 Description Diagnosis I87.313 Chronic venous hypertension (idiopathic) with ulcer of bilateral lower extremity L97.212 Non-pressure chronic ulcer of right calf  with fat layer exposed L97.222 Non-pressure chronic ulcer of left calf with fat layer exposed Electronic Signature(s) Signed: 07/26/2015 8:45:31 AM By: Evlyn Kanner MD, FACS Entered By: Evlyn Kanner on 07/26/2015 08:45:31

## 2015-07-27 NOTE — Progress Notes (Signed)
CHARLE, CLEAR ([id number]) Visit Report for 07/26/2015 Arrival Information Details Patient Name: [name], [name]. Date of Service: 07/26/2015 8:00 AM Medical Record Number: [id number] Patient Account Number: [id number] Date of Birth/Sex: 1971/10/07 (44 y.o. Male) Treating RN: Curtis Sites Primary Care Physician: Mayford Knife, ERIC Other Clinician: Referring Physician: Mayford Knife, ERIC Treating Physician/Extender: Rudene Re in Treatment: 10 Visit Information History Since Last Visit Added or deleted any medications: No Patient Arrived: Ambulatory Any new allergies or adverse reactions: No Arrival Time: 08:16 Had a fall or experienced change in No Accompanied By: spouse activities of daily living that may affect Transfer Assistance: None risk of falls: Patient Identification Verified: Yes Signs or symptoms of abuse/neglect since last No Secondary Verification Process Yes visito Completed: Hospitalized since last visit: No Patient Has Alerts: Yes Pain Present Now: No Patient Alerts: Patient on Blood Thinner Electronic Signature(s) Signed: 07/26/2015 5:17:19 PM By: Curtis Sites Entered By: Curtis Sites on 07/26/2015 08:16:31 Marinaro, [name] ([id number]) -------------------------------------------------------------------------------- Clinic Level of Care Assessment Details Patient Name: Oscar Mirza C. Date of Service: 07/26/2015 8:00 AM Medical Record Number: [id number] Patient Account Number: [id number] Date of Birth/Sex: [birth year] (44 y.o. Male) Treating RN: Curtis Sites Primary Care Physician: Mayford Knife, ERIC Other Clinician: Referring Physician: Mayford Knife, ERIC Treating Physician/Extender: Rudene Re in Treatment: 10 Clinic Level of Care Assessment Items TOOL 4 Quantity Score []  - Use when only an EandM is performed on FOLLOW-UP visit 0 ASSESSMENTS - Nursing Assessment / Reassessment X - Reassessment of Co-morbidities (includes updates in patient  status) 1 10 X - Reassessment of Adherence to Treatment Plan 1 5 ASSESSMENTS - Wound and Skin Assessment / Reassessment []  - Simple Wound Assessment / Reassessment - one wound 0 []  - Complex Wound Assessment / Reassessment - multiple wounds 0 []  - Dermatologic / Skin Assessment (not related to wound area) 0 ASSESSMENTS - Focused Assessment X - Circumferential Edema Measurements - multi extremities 1 5 []  - Nutritional Assessment / Counseling / Intervention 0 X - Lower Extremity Assessment (monofilament, tuning fork, pulses) 1 5 []  - Peripheral Arterial Disease Assessment (using hand held doppler) 0 ASSESSMENTS - Ostomy and/or Continence Assessment and Care []  - Incontinence Assessment and Management 0 []  - Ostomy Care Assessment and Management (repouching, etc.) 0 PROCESS - Coordination of Care X - Simple Patient / Family Education for ongoing care 1 15 []  - Complex (extensive) Patient / Family Education for ongoing care 0 []  - Staff obtains , Records, Test Results / Process Orders 0 []  - Staff telephones HHA, Nursing Homes / Clarify orders / etc 0 []  - Routine Transfer to another Facility (non-emergent condition) 0 Timmons, [name] ( ) []  - Routine Hospital Admission (non-emergent condition) 0 []  - New Admissions / / Ordering NPWT, Apligraf, etc. 0 []  - Emergency Hospital Admission (emergent condition) 0 X - Simple Discharge Coordination 1 10 []  - Complex (extensive) Discharge Coordination 0 PROCESS - Special Needs []  - Pediatric / Minor Patient Management 0 []  - Isolation Patient Management 0 []  - Hearing / Language / Visual special needs 0 []  - Assessment of Community assistance (transportation, D/C planning, etc.) 0 []  - Additional assistance / Altered mentation 0 []  - Support Surface(s) Assessment (bed, cushion, seat, etc.) 0 INTERVENTIONS - Wound Cleansing / Measurement []  - Simple Wound Cleansing - one wound 0 []  - Complex Wound  Cleansing - multiple wounds 0 []  - Wound Imaging (photographs - any number of wounds) 0 []  - Wound Tracing (instead of photographs) 0 []  -  Simple Wound Measurement - one wound 0 []  - Complex Wound Measurement - multiple wounds 0 INTERVENTIONS - Wound Dressings []  - Small Wound Dressing one or multiple wounds 0 []  - Medium Wound Dressing one or multiple wounds 0 []  - Large Wound Dressing one or multiple wounds 0 []  - Application of Medications - topical 0 []  - Application of Medications - injection 0 INTERVENTIONS - Miscellaneous []  - External ear exam 0 Mower, Khair C. ([id number]) []  - Specimen Collection (cultures, biopsies, blood, body fluids, etc.) 0 []  - Specimen(s) / Culture(s) sent or taken to Lab for analysis 0 []  - Patient Transfer (multiple staff / Michiel Sites Lift / Similar devices) 0 []  - Simple Staple / Suture removal (25 or less) 0 []  - Complex Staple / Suture removal (26 or more) 0 []  - Hypo / Hyperglycemic Management (close monitor of Blood Glucose) 0 []  - Ankle / Brachial Index (ABI) - do not check if billed separately 0 X - Vital Signs 1 5 Has the patient been seen at the hospital within the last three years: Yes Total Score: 55 Level Of Care: New/Established - Level 2 Electronic Signature(s) Signed: 07/26/2015 5:17:19 PM By: Curtis Sites Entered By: Curtis Sites on 07/26/2015 08:39:01 Decelle, [name] ([id number]) -------------------------------------------------------------------------------- Encounter Discharge Information Details Patient Name: Oscar Mirza C. Date of Service: 07/26/2015 8:00 AM Medical Record Number: [id number] Patient Account Number: [id number] Date of Birth/Sex: 10/18/1971 (44 y.o. Male) Treating RN: Curtis Sites Primary Care Physician: Mayford Knife, ERIC Other Clinician: Referring Physician: Mayford Knife, ERIC Treating Physician/Extender: Rudene Re in Treatment: 10 Encounter Discharge Information Items Discharge Pain Level:  0 Discharge Condition: Stable Ambulatory Status: Ambulatory Discharge Destination: Home Transportation: Private Auto Accompanied By: spouse Schedule Follow-up Appointment: No Medication Reconciliation completed and provided to Patient/Care No Laruth Hanger: Provided on Clinical Summary of Care: 07/26/2015 Form Type Recipient Paper Patient EM Electronic Signature(s) Signed: 07/26/2015 8:49:17 AM By: Gwenlyn Perking Entered By: Gwenlyn Perking on 07/26/2015 08:49:17 Bischoff, [name] ([id number]) -------------------------------------------------------------------------------- Lower Extremity Assessment Details Patient Name: [name], [name] Date of Service: 07/26/2015 8:00 AM Medical Record Number: [id number] Patient Account Number: [id number] Date of Birth/Sex: 09-12-1971 (44 y.o. Male) Treating RN: Curtis Sites Primary Care Physician: Mayford Knife, ERIC Other Clinician: Referring Physician: Mayford Knife, ERIC Treating Physician/Extender: Rudene Re in Treatment: 10 Edema Assessment Assessed: [Left: No] [Right: No] Edema: [Left: Ye] [Right: s] Calf Left: Right: Point of Measurement: 33 cm From Medial Instep cm 44.6 cm Ankle Left: Right: Point of Measurement: 11 cm From Medial Instep cm 28.6 cm Vascular Assessment Pulses: Posterior Tibial Dorsalis Pedis Palpable: [Right:Yes] Extremity colors, hair growth, and conditions: Extremity Color: [Right:Hyperpigmented] Hair Growth on Extremity: [Right:No] Temperature of Extremity: [Right:Warm] Capillary Refill: [Right:< 3 seconds] Toe Nail Assessment Left: Right: Thick: Yes Discolored: Yes Deformed: Yes Improper Length and Hygiene: Yes Electronic Signature(s) Signed: 07/26/2015 5:17:19 PM By: Curtis Sites Entered By: Curtis Sites on 07/26/2015 08:29:29 Cliff, [name] ([id number]) -------------------------------------------------------------------------------- Multi-Disciplinary Care Plan Details Patient Name: Oscar Mirza C. Date of Service: 07/26/2015 8:00 AM Medical Record Number: [id number] Patient Account Number: [id number] Date of Birth/Sex: 1971-07-25 (44 y.o. Male) Treating RN: Curtis Sites Primary Care Physician: Mayford Knife, ERIC Other Clinician: Referring Physician: Mayford Knife, ERIC Treating Physician/Extender: Rudene Re in Treatment: 10 Active Inactive Electronic Signature(s) Signed: 07/26/2015 5:17:19 PM By: Curtis Sites Entered By: Curtis Sites on 07/26/2015 08:37:43 Thunder, [name] ([id number]) -------------------------------------------------------------------------------- Patient/Caregiver Education Details Patient Name: Oscar Civil. Date of Service: 07/26/2015 8:00 AM Medical Record Number: [id number] Patient Account Number:  [id number] Date of Birth/Gender: December 09, 1970 (44 y.o. Male) Treating RN: Curtis Sites Primary Care Physician: Mayford Knife, ERIC Other Clinician: Referring Physician: Mayford Knife, ERIC Treating Physician/Extender: Rudene Re in Treatment: 10 Education Assessment Education Provided To: Patient and Caregiver Education Topics Provided Venous: Handouts: Other: continue wearing compression hose daily Methods: Explain/Verbal Responses: State content correctly Electronic Signature(s) Signed: 07/26/2015 5:17:19 PM By: Curtis Sites Entered By: Curtis Sites on 07/26/2015 08:30:08 Radebaugh, [name] ([id number]) -------------------------------------------------------------------------------- Wound Assessment Details Patient Name: [name], [name] Date of Service: 07/26/2015 8:00 AM Medical Record Number: [id number] Patient Account Number: [id number] Date of Birth/Sex: 09/08/1971 (44 y.o. Male) Treating RN: Curtis Sites Primary Care Physician: Mayford Knife, ERIC Other Clinician: Referring Physician: Mayford Knife, ERIC Treating Physician/Extender: Rudene Re in Treatment: 10 Wound Status Wound Number: 1 Primary Etiology: Venous Leg  Ulcer Wound Location: Right, Lateral Lower Leg Wound Status: Healed - Epithelialized Wounding Event: Gradually Appeared Date Acquired: 01/26/2015 Weeks Of Treatment: 10 Clustered Wound: No Photos Photo Uploaded By: Curtis Sites on 07/26/2015 09:29:25 Wound Measurements Length: (cm) 0 % Reducti Width: (cm) 0 % Reducti Depth: (cm) 0 Area: (cm) 0 Volume: (cm) 0 on in Area: 100% on in Volume: 100% Wound Description Full Thickness Without Exposed Classification: Support Structures Periwound Skin Texture Texture Color No Abnormalities Noted: No No Abnormalities Noted: No Moisture No Abnormalities Noted: No Electronic Signature(s) Signed: 07/26/2015 5:17:19 PM By: Nida Boatman, [name] ([id number]) Entered By: Curtis Sites on 07/26/2015 08:27:03 Milholland, [name] ([id number]) -------------------------------------------------------------------------------- Vitals Details Patient Name: [name], [name] Date of Service: 07/26/2015 8:00 AM Medical Record Number: [id number] Patient Account Number: [id number] Date of Birth/Sex: September 18, 1971 (44 y.o. Male) Treating RN: Curtis Sites Primary Care Physician: Mayford Knife, ERIC Other Clinician: Referring Physician: Mayford Knife, ERIC Treating Physician/Extender: Rudene Re in Treatment: 10 Vital Signs Time Taken: 08:16 Temperature (F): 98.3 Height (in): 72 Pulse (bpm): 85 Weight (lbs): 386 Respiratory Rate (breaths/min): 20 Body Mass Index (BMI): 52.3 Blood Pressure (mmHg): 145/66 Reference Range: 80 - 120 mg / dl Electronic Signature(s) Signed: 07/26/2015 5:17:19 PM By: Curtis Sites Entered By: Curtis Sites on 07/26/2015 08:18:20

## 2015-09-03 DIAGNOSIS — M1611 Unilateral primary osteoarthritis, right hip: Secondary | ICD-10-CM | POA: Insufficient documentation

## 2018-01-21 DIAGNOSIS — Z719 Counseling, unspecified: Secondary | ICD-10-CM | POA: Diagnosis not present

## 2018-01-21 DIAGNOSIS — Z6841 Body Mass Index (BMI) 40.0 and over, adult: Secondary | ICD-10-CM | POA: Diagnosis not present

## 2018-01-21 DIAGNOSIS — Z008 Encounter for other general examination: Secondary | ICD-10-CM | POA: Diagnosis not present

## 2018-01-21 DIAGNOSIS — Z1331 Encounter for screening for depression: Secondary | ICD-10-CM | POA: Diagnosis not present

## 2018-04-15 DIAGNOSIS — Z6841 Body Mass Index (BMI) 40.0 and over, adult: Secondary | ICD-10-CM | POA: Diagnosis not present

## 2018-04-15 DIAGNOSIS — Z008 Encounter for other general examination: Secondary | ICD-10-CM | POA: Diagnosis not present

## 2018-04-15 DIAGNOSIS — Z719 Counseling, unspecified: Secondary | ICD-10-CM | POA: Diagnosis not present

## 2018-05-13 DIAGNOSIS — Z008 Encounter for other general examination: Secondary | ICD-10-CM | POA: Diagnosis not present

## 2018-05-13 DIAGNOSIS — Z6841 Body Mass Index (BMI) 40.0 and over, adult: Secondary | ICD-10-CM | POA: Diagnosis not present

## 2018-05-13 DIAGNOSIS — Z7189 Other specified counseling: Secondary | ICD-10-CM | POA: Diagnosis not present

## 2018-05-13 DIAGNOSIS — Z719 Counseling, unspecified: Secondary | ICD-10-CM | POA: Diagnosis not present

## 2018-06-16 ENCOUNTER — Ambulatory Visit: Payer: BLUE CROSS/BLUE SHIELD | Admitting: Adult Health

## 2018-06-16 ENCOUNTER — Encounter: Payer: Self-pay | Admitting: Adult Health

## 2018-06-16 VITALS — BP 156/86 | HR 89 | Resp 18 | Ht 72.0 in | Wt 386.0 lb

## 2018-06-16 DIAGNOSIS — G4733 Obstructive sleep apnea (adult) (pediatric): Secondary | ICD-10-CM | POA: Diagnosis not present

## 2018-06-16 DIAGNOSIS — R030 Elevated blood-pressure reading, without diagnosis of hypertension: Secondary | ICD-10-CM | POA: Diagnosis not present

## 2018-06-16 DIAGNOSIS — Z9989 Dependence on other enabling machines and devices: Secondary | ICD-10-CM

## 2018-06-16 DIAGNOSIS — B070 Plantar wart: Secondary | ICD-10-CM | POA: Diagnosis not present

## 2018-06-16 NOTE — Progress Notes (Signed)
[facility] [address] [place], [place] [address]  Internal MEDICINE  Office Visit Note  Patient Name: [name]  [id number]  [id number]  Date of Service: 07/04/2018  Chief Complaint  Patient presents with  . Osteoarthritis    re-establishing care , knots on the bottom of feet   . Sleep Apnea    HPI Pt here to reestablish care.  Since his last visit in 2016 he has had both hips replaced and has done well with that.  He is here today reporting that his feet have been bothering him for about a year.  He works standing on his feet and states after a few hours his feet begin to burn.  He is able to continue working however he would like to be seen by podiatry.      Current Medication: Outpatient Encounter Medications as of 06/16/2018  Medication Sig  . naproxen sodium (ALEVE) 220 MG tablet Take 220 mg by mouth.  . [DISCONTINUED] cyclobenzaprine (FLEXERIL) 10 MG tablet Take 10 mg by mouth 3 (three) times daily as needed for muscle spasms.  . [DISCONTINUED] furosemide (LASIX) 20 MG tablet Take 20 mg by mouth daily as needed (leg swelling).  . [DISCONTINUED] naproxen sodium (ANAPROX) 220 MG tablet Take 220 mg by mouth 2 (two) times daily with a meal.  . [DISCONTINUED] oxycodone (OXY-IR) 5 MG capsule Take 5 mg by mouth every 4 (four) hours as needed.  . [DISCONTINUED] phentermine 37.5 MG capsule Take 37.5 mg by mouth every morning.  . [DISCONTINUED] rivaroxaban (XARELTO) 10 MG TABS tablet Take 10 mg by mouth daily.  . [DISCONTINUED] senna (SENOKOT) 8.6 MG tablet Take 1 tablet by mouth daily.   No facility-administered encounter medications on file as of 06/16/2018.     Surgical History: Past Surgical History:  Procedure Laterality Date  . REPLACEMENT TOTAL HIP W/  RESURFACING IMPLANTS Left   . STERIOD INJECTION N/A 01/19/2013   Procedure: STEROID INJECTION;  Surgeon: Gwynne Edinger, MD;  Location: [facility];  Service: Neurosurgery;  Laterality: N/A;  Lumbar four  - five Lumbar Epidural Steroid Injection    Medical History: Past Medical History:  Diagnosis Date  . Arthritis   . Chronic kidney disease    kidney stones  . Edema leg   . Sleep apnea     Family History: History reviewed. No pertinent family history.  Social History   Socioeconomic History  . Marital status: Single    Spouse name: Not on file  . Number of children: Not on file  . Years of education: Not on file  . Highest education level: Not on file  Occupational History  . Not on file  Social Needs  . Financial resource strain: Not on file  . Food insecurity:    Worry: Not on file    Inability: Not on file  . Transportation needs:    Medical: Not on file    Non-medical: Not on file  Tobacco Use  . Smoking status: Never Smoker  . Smokeless tobacco: Never Used  Substance and Sexual Activity  . Alcohol use: Yes    Comment: occasionally  . Drug use: No  . Sexual activity: Not on file  Lifestyle  . Physical activity:    Days per week: Not on file    Minutes per session: Not on file  . Stress: Not on file  Relationships  . Social connections:    Talks on phone: Not on file    Gets together:  Not on file    Attends religious service: Not on file    Active member of club or organization: Not on file    Attends meetings of clubs or organizations: Not on file    Relationship status: Not on file  . Intimate partner violence:    Fear of current or ex partner: Not on file    Emotionally abused: Not on file    Physically abused: Not on file    Forced sexual activity: Not on file  Other Topics Concern  . Not on file  Social History Narrative  . Not on file      Review of Systems  Constitutional: Negative.  Negative for chills, fatigue and unexpected weight change.  HENT: Negative.  Negative for congestion, rhinorrhea, sneezing and sore throat.   Eyes: Negative for redness.  Respiratory: Negative.  Negative for cough, chest tightness and shortness of breath.    Cardiovascular: Negative.  Negative for chest pain and palpitations.  Gastrointestinal: Negative.  Negative for abdominal pain, constipation, diarrhea, nausea and vomiting.  Endocrine: Negative.   Genitourinary: Negative.  Negative for dysuria and frequency.  Musculoskeletal: Negative.  Negative for arthralgias, back pain, joint swelling and neck pain.  Skin: Negative.  Negative for rash.  Allergic/Immunologic: Negative.   Neurological: Negative.  Negative for tremors and numbness.  Hematological: Negative for adenopathy. Does not bruise/bleed easily.  Psychiatric/Behavioral: Negative.  Negative for behavioral problems, sleep disturbance and suicidal ideas. The patient is not nervous/anxious.     Vital Signs: BP (!) 156/86   Pulse 89   Resp 18   Ht 6' (1.829 m)   Wt (!) 386 lb (175.1 kg)   SpO2 95%   BMI 52.35 kg/m    Physical Exam  Constitutional: He is oriented to person, place, and time. He appears well-developed and well-nourished. No distress.  HENT:  Head: Normocephalic and atraumatic.  Mouth/Throat: Oropharynx is clear and moist. No oropharyngeal exudate.  Eyes: Pupils are equal, round, and reactive to light. EOM are normal.  Neck: Normal range of motion. Neck supple. No JVD present. No tracheal deviation present. No thyromegaly present.  Cardiovascular: Normal rate, regular rhythm and normal heart sounds. Exam reveals no gallop and no friction rub.  No murmur heard. Pulmonary/Chest: Effort normal and breath sounds normal. No respiratory distress. He has no wheezes. He has no rales. He exhibits no tenderness.  Abdominal: Soft. There is no tenderness. There is no guarding.  Musculoskeletal: Normal range of motion.  Lymphadenopathy:    He has no cervical adenopathy.  Neurological: He is alert and oriented to person, place, and time. No cranial nerve deficit.  Skin: Skin is warm and dry. He is not diaphoretic.  Psychiatric: He has a normal mood and affect. His behavior is  normal. Judgment and thought content normal.  Nursing note and vitals reviewed.  Assessment/Plan: 1. Plantar warts Pt has multiple calloused and painful places that podiatry can help with.   - Ambulatory referral to Podiatry  2. OSA on CPAP He continues to use his CPAP with no difficulty.   3. Elevated blood pressure reading Slightly elevated today, continue to monitor in the future.     General Counseling: keylen eckenrode understanding of the findings of todays visit and agrees with plan of treatment. I have discussed any further diagnostic evaluation that may be needed or ordered today. We also reviewed his medications today. he has been encouraged to call the office with any questions or concerns that should arise related to  todays visit.    Orders Placed This Encounter  Procedures  . Ambulatory referral to Podiatry     Time spent: 25 Minutes   This patient was seen by Blima Ledger AGNP-C in Collaboration with Dr Lyndon Code as a part of collaborative care agreement    Dr Lyndon Code Internal medicine

## 2018-06-16 NOTE — Patient Instructions (Signed)
Plantar Warts Plantar warts are small growths on the bottom of the foot (sole). Warts are caused by a type of germ (virus). Most warts are not painful, and they usually do not cause problems. Sometimes, plantar warts can cause pain when you walk. Warts often go away on their own in time. Treatments may be done if needed. Follow these instructions at home: General instructions  Apply creams or solutions only as told by your doctor. Follow these steps if your doctor tells you to do so: ? Soak your foot in warm water. ? Remove the top layer of softened skin before you apply the medicine. You can use a pumice stone to remove the tissue. ? After you apply the medicine, put a bandage over the area of the wart. ? Repeat the process every day or as told by your doctor.  Do not scratch or pick at a wart.  Wash your hands after you touch a wart.  If a wart is painful, try putting a bandage with a hole in the middle over the wart.  Keep all follow-up visits as told by your doctor. This is important. Prevention  Wear shoes and socks. Change socks every day.  Keep your feet clean and dry.  Check your feet often.  Avoid direct contact with warts on other people. Contact a doctor if:  Your warts do not improve after treatment.  You have redness, swelling, or pain at the site of a wart.  You have bleeding from a wart, and the bleeding does not stop when you put light pressure on the wart.  You have diabetes and you get a wart. This information is not intended to replace advice given to you by your health care provider. Make sure you discuss any questions you have with your health care provider. Document Released: 11/15/2010 Document Revised: 03/20/2016 Document Reviewed: 01/08/2015 Elsevier Interactive Patient Education  2018 [employer] Inc[employer]  

## 2018-06-24 DIAGNOSIS — Z713 Dietary counseling and surveillance: Secondary | ICD-10-CM | POA: Diagnosis not present

## 2018-06-24 DIAGNOSIS — Z6841 Body Mass Index (BMI) 40.0 and over, adult: Secondary | ICD-10-CM | POA: Diagnosis not present

## 2018-07-02 ENCOUNTER — Encounter: Payer: Self-pay | Admitting: Podiatry

## 2018-07-02 ENCOUNTER — Ambulatory Visit (INDEPENDENT_AMBULATORY_CARE_PROVIDER_SITE_OTHER): Payer: BLUE CROSS/BLUE SHIELD

## 2018-07-02 ENCOUNTER — Ambulatory Visit: Payer: BLUE CROSS/BLUE SHIELD | Admitting: Podiatry

## 2018-07-02 ENCOUNTER — Other Ambulatory Visit: Payer: Self-pay | Admitting: Podiatry

## 2018-07-02 DIAGNOSIS — M779 Enthesopathy, unspecified: Secondary | ICD-10-CM

## 2018-07-02 DIAGNOSIS — B351 Tinea unguium: Secondary | ICD-10-CM | POA: Diagnosis not present

## 2018-07-02 DIAGNOSIS — L989 Disorder of the skin and subcutaneous tissue, unspecified: Secondary | ICD-10-CM

## 2018-07-02 DIAGNOSIS — M79676 Pain in unspecified toe(s): Secondary | ICD-10-CM

## 2018-07-02 DIAGNOSIS — M778 Other enthesopathies, not elsewhere classified: Secondary | ICD-10-CM

## 2018-07-02 MED ORDER — TERBINAFINE HCL 250 MG PO TABS: 250.0000 mg | ORAL_TABLET | Freq: Every day | ORAL | 0 refills | Status: AC

## 2018-07-05 NOTE — Progress Notes (Signed)
    Subjective: Patient is a 47 y.o. male presenting to the office today as a new patient with a chief complaint of painful callus lesions to the bilateral feet that have been present for the past few years. Walking and standing increases the pain. He wears compression stockings and has tried filing the calluses for treatment.  Patient also complains of elongated, thickened nails that cause pain while ambulating in shoes. He is unable to trim his own nails. Patient presents today for further treatment and evaluation.  Past Medical History:  Diagnosis Date  . Arthritis   . Chronic kidney disease    kidney stones  . Edema leg   . Sleep apnea     Objective:  Physical Exam General: Alert and oriented x3 in no acute distress  Dermatology: Hyperkeratotic lesions present on the bilateral feet x 3. Pain on palpation with a central nucleated core noted. Skin is warm, dry and supple bilateral lower extremities. Negative for open lesions or macerations. Nails are tender, long, thickened and dystrophic with subungual debris, consistent with onychomycosis, 1-5 bilateral. No signs of infection noted.  Vascular: Palpable pedal pulses bilaterally. No edema or erythema noted. Capillary refill within normal limits.  Neurological: Epicritic and protective threshold grossly intact bilaterally.   Musculoskeletal Exam: Pain on palpation at the keratotic lesion noted. Range of motion within normal limits bilateral. Muscle strength 5/5 in all groups bilateral.  Assessment: 1. Onychodystrophic nails 1-5 bilateral with hyperkeratosis of nails.  2. Onychomycosis of nail due to dermatophyte bilateral 3. Pre-ulcerative callus lesions noted to the bilateral feet x 3   Plan of Care:  1. Patient evaluated. 2. Excisional debridement of keratoic lesion using a chisel blade was performed without incident.  3. Dressed with light dressing. 4. Mechanical debridement of nails 1-5 bilaterally performed using a nail  nipper. Filed with dremel without incident.  5. Prescription for Lamisil 250 mg #90 provided to patient.  6. Orders for LFTs placed today.  7. Patient is to return to the clinic as needed.  Wife's name is [place].    Felecia Shelling, DPM [facility]  Dr. Felecia Shelling, DPM    [address]                                        [place], [place] [address]                Office [phone]  Fax [phone]

## 2018-08-13 ENCOUNTER — Encounter: Payer: Self-pay | Admitting: Adult Health

## 2018-09-02 DIAGNOSIS — Z008 Encounter for other general examination: Secondary | ICD-10-CM | POA: Diagnosis not present

## 2018-09-02 DIAGNOSIS — Z6841 Body Mass Index (BMI) 40.0 and over, adult: Secondary | ICD-10-CM | POA: Diagnosis not present

## 2018-09-02 DIAGNOSIS — Z719 Counseling, unspecified: Secondary | ICD-10-CM | POA: Diagnosis not present

## 2018-09-16 DIAGNOSIS — Z7689 Persons encountering health services in other specified circumstances: Secondary | ICD-10-CM | POA: Diagnosis not present

## 2018-09-16 DIAGNOSIS — Z6841 Body Mass Index (BMI) 40.0 and over, adult: Secondary | ICD-10-CM | POA: Diagnosis not present

## 2018-10-07 DIAGNOSIS — Z6841 Body Mass Index (BMI) 40.0 and over, adult: Secondary | ICD-10-CM | POA: Diagnosis not present

## 2018-10-07 DIAGNOSIS — Z7689 Persons encountering health services in other specified circumstances: Secondary | ICD-10-CM | POA: Diagnosis not present

## 2018-10-29 ENCOUNTER — Encounter: Payer: Self-pay | Admitting: Adult Health

## 2018-10-29 ENCOUNTER — Ambulatory Visit: Payer: BLUE CROSS/BLUE SHIELD | Admitting: Adult Health

## 2018-10-29 VITALS — BP 115/66 | HR 85 | Resp 16 | Ht 72.0 in | Wt 382.0 lb

## 2018-10-29 DIAGNOSIS — M25552 Pain in left hip: Secondary | ICD-10-CM | POA: Diagnosis not present

## 2018-10-29 DIAGNOSIS — Z9989 Dependence on other enabling machines and devices: Secondary | ICD-10-CM | POA: Diagnosis not present

## 2018-10-29 DIAGNOSIS — M25562 Pain in left knee: Secondary | ICD-10-CM

## 2018-10-29 DIAGNOSIS — G4733 Obstructive sleep apnea (adult) (pediatric): Secondary | ICD-10-CM

## 2018-10-29 MED ORDER — FUROSEMIDE 20 MG PO TABS: 20.0000 mg | ORAL_TABLET | Freq: Every day | ORAL | 0 refills | Status: AC

## 2018-10-29 NOTE — Progress Notes (Signed)
[facility] [address] [place], [place] [address]  Internal MEDICINE  Office Visit Note  Patient Name: [name]  [id number]  [id number]  Date of Service: 10/30/2018  Chief Complaint  Patient presents with  . Knee Pain    left knee, outer part of knee  . Hip Pain    2 hip replacements, left hip, feels more pain on his thigh than hip     HPI Pt is here for a sick visit.  He reports for approximately 2 weeks the lateral portion of his left knee has been bothering him.  It has been so painful that he has resorted to walking with a walker.  He also has pain in his left hip.  It is of note that the patient has had bilateral hip replacements in the past 3 years.  He denies any trauma or fall at this time.  No fever chills or other symptoms.  He is here today requesting a referral to orthopedics.    Current Medication:  Outpatient Encounter Medications as of 10/29/2018  Medication Sig  . Misc Natural Products (OSTEO BI-FLEX JOINT SHIELD PO) Take by mouth.  . naproxen sodium (ALEVE) 220 MG tablet Take 220 mg by mouth.  . furosemide (LASIX) 20 MG tablet Take 1 tablet (20 mg total) by mouth daily.  Marland Kitchen terbinafine (LAMISIL) 250 MG tablet Take 1 tablet (250 mg total) by mouth daily. (Patient not taking: Reported on 10/29/2018)   No facility-administered encounter medications on file as of 10/29/2018.       Medical History: Past Medical History:  Diagnosis Date  . Arthritis   . Chronic kidney disease    kidney stones  . Edema leg   . Sleep apnea      Vital Signs: BP 115/66   Pulse 85   Resp 16   Ht 6' (1.829 m)   Wt (!) 382 lb (173.3 kg)   SpO2 97%   BMI 51.81 kg/m    Review of Systems  Constitutional: Negative.  Negative for chills, fatigue and unexpected weight change.  HENT: Negative.  Negative for congestion, rhinorrhea, sneezing and sore throat.   Eyes: Negative for redness.  Respiratory: Negative.  Negative for cough, chest tightness and  shortness of breath.   Cardiovascular: Negative.  Negative for chest pain and palpitations.  Gastrointestinal: Negative.  Negative for abdominal pain, constipation, diarrhea, nausea and vomiting.  Endocrine: Negative.   Genitourinary: Negative.  Negative for dysuria and frequency.  Musculoskeletal: Negative.  Negative for arthralgias, back pain, joint swelling and neck pain.  Skin: Negative.  Negative for rash.  Allergic/Immunologic: Negative.   Neurological: Negative.  Negative for tremors and numbness.  Hematological: Negative for adenopathy. Does not bruise/bleed easily.  Psychiatric/Behavioral: Negative.  Negative for behavioral problems, sleep disturbance and suicidal ideas. The patient is not nervous/anxious.     Physical Exam Vitals signs and nursing note reviewed.  Constitutional:      General: He is not in acute distress.    Appearance: He is well-developed. He is not diaphoretic.  HENT:     Head: Normocephalic and atraumatic.     Mouth/Throat:     Pharynx: No oropharyngeal exudate.  Eyes:     Pupils: Pupils are equal, round, and reactive to light.  Neck:     Musculoskeletal: Normal range of motion and neck supple.     Thyroid: No thyromegaly.     Vascular: No JVD.     Trachea: No tracheal deviation.  Cardiovascular:  Rate and Rhythm: Normal rate and regular rhythm.     Heart sounds: Normal heart sounds. No murmur. No friction rub. No gallop.   Pulmonary:     Effort: Pulmonary effort is normal. No respiratory distress.     Breath sounds: Normal breath sounds. No wheezing or rales.  Chest:     Chest wall: No tenderness.  Abdominal:     Palpations: Abdomen is soft.     Tenderness: There is no abdominal tenderness. There is no guarding.  Musculoskeletal: Normal range of motion.  Lymphadenopathy:     Cervical: No cervical adenopathy.  Skin:    General: Skin is warm and dry.  Neurological:     Mental Status: He is alert and oriented to person, place, and time.      Cranial Nerves: No cranial nerve deficit.  Psychiatric:        Behavior: Behavior normal.        Thought Content: Thought content normal.        Judgment: Judgment normal.    Assessment/Plan: 1. Acute pain of left knee Patient has good motion of his knee and denies any trauma.  Patient will be referred to orthopedics. - Ambulatory referral to Orthopedic Surgery  2. Left hip pain Patient has good range of motion with his left hip.  He also will be referred to orthopedics for this as well. - Ambulatory referral to Orthopedic Surgery  3. OSA on CPAP Continue to use CPAP as prescribed.  General Counseling: Dondra Praderarl verbalizes understanding of the findings of todays visit and agrees with plan of treatment. I have discussed any further diagnostic evaluation that may be needed or ordered today. We also reviewed his medications today. he has been encouraged to call the office with any questions or concerns that should arise related to todays visit.   Orders Placed This Encounter  Procedures  . Ambulatory referral to Orthopedic Surgery    Meds ordered this encounter  Medications  . furosemide (LASIX) 20 MG tablet    Sig: Take 1 tablet (20 mg total) by mouth daily.    Dispense:  30 tablet    Refill:  0    Time spent: 25 Minutes  This patient was seen by Blima LedgerAdam Ethne Jeon AGNP-C in Collaboration with Dr Lyndon CodeFozia M Khan as a part of collaborative care agreement.  Johnna AcostaAdam J. Lindi Abram AGNP-C Internal Medicine

## 2018-10-29 NOTE — Patient Instructions (Signed)

## 2018-11-04 ENCOUNTER — Ambulatory Visit: Payer: BLUE CROSS/BLUE SHIELD | Admitting: Nurse Practitioner

## 2018-11-04 ENCOUNTER — Encounter: Payer: Self-pay | Admitting: Nurse Practitioner

## 2018-11-04 VITALS — BP 141/74 | HR 92 | Resp 16 | Ht 72.0 in | Wt 381.0 lb

## 2018-11-04 DIAGNOSIS — R262 Difficulty in walking, not elsewhere classified: Secondary | ICD-10-CM

## 2018-11-04 DIAGNOSIS — M25552 Pain in left hip: Secondary | ICD-10-CM | POA: Diagnosis not present

## 2018-11-04 NOTE — Progress Notes (Signed)
[facility] [address] [place], [place] [address]  Internal MEDICINE  Office Visit Note  Patient Name: [name]  [id number]  [id number]  Date of Service: 11/07/2018   Pt is here for a sick visit.   Chief Complaint  Patient presents with  . Pain  . Letter for School/Work    pt is having leg pain and requesting to be taken out of work to the pain.     Patient has severe pain in left hip. Has been gradually getting worse over past few weeks. Pain has become so severe that he needs to use a walker to get around. He was out of work Thursday and Friday last week. Did try to go back to work on Monday, but was sent home, as coworkers and boss could see how much pain he was in. He does have appointment with orthopedics next Wednesday. He is asking to get a work note to cover him until he is seen by orthopedic provider.   It is of note that the patient has had bilateral hip replacements in the past 3 years.      Current Medication:  Outpatient Encounter Medications as of 11/04/2018  Medication Sig  . furosemide (LASIX) 20 MG tablet Take 1 tablet (20 mg total) by mouth daily.  . Misc Natural Products (OSTEO BI-FLEX JOINT SHIELD PO) Take by mouth.  . naproxen sodium (ALEVE) 220 MG tablet Take 220 mg by mouth.  . terbinafine (LAMISIL) 250 MG tablet Take 1 tablet (250 mg total) by mouth daily. (Patient not taking: Reported on 10/29/2018)   No facility-administered encounter medications on file as of 11/04/2018.       Medical History: Past Medical History:  Diagnosis Date  . Arthritis   . Chronic kidney disease    kidney stones  . Edema leg   . Sleep apnea      Today's Vitals   11/04/18 1354  BP: (!) 141/74  Pulse: 92  Resp: 16  SpO2: 96%  Weight: (!) 381 lb (172.8 kg)  Height: 6' (1.829 m)    Review of Systems  Constitutional: Positive for fatigue. Negative for chills and unexpected weight change.  HENT: Negative for congestion, postnasal drip,  rhinorrhea, sneezing and sore throat.   Respiratory: Negative for cough, chest tightness and shortness of breath.   Cardiovascular: Negative for chest pain and palpitations.  Gastrointestinal: Negative for abdominal pain, constipation, diarrhea, nausea and vomiting.  Genitourinary: Negative for dysuria and frequency.  Musculoskeletal: Positive for arthralgias, gait problem and myalgias. Negative for back pain, joint swelling and neck pain.       Left hip and left knee pain which is so severe he is having trouble standing and walking.   Skin: Negative for rash.  Neurological: Positive for weakness. Negative for tremors and numbness.  Hematological: Negative for adenopathy. Does not bruise/bleed easily.  Psychiatric/Behavioral: Negative for behavioral problems (Depression), sleep disturbance and suicidal ideas. The patient is not nervous/anxious.     Physical Exam Vitals signs and nursing note reviewed.  Constitutional:      General: He is in acute distress.     Appearance: He is well-developed. He is obese. He is not diaphoretic.  HENT:     Head: Normocephalic and atraumatic.     Mouth/Throat:     Pharynx: No oropharyngeal exudate.  Eyes:     Pupils: Pupils are equal, round, and reactive to light.  Neck:     Musculoskeletal: Normal range of motion and neck  supple.     Thyroid: No thyromegaly.     Vascular: No JVD.     Trachea: No tracheal deviation.  Cardiovascular:     Rate and Rhythm: Normal rate and regular rhythm.     Heart sounds: Normal heart sounds. No murmur. No friction rub. No gallop.   Pulmonary:     Effort: Pulmonary effort is normal. No respiratory distress.     Breath sounds: No wheezing or rales.  Chest:     Chest wall: No tenderness.  Abdominal:     General: Bowel sounds are normal.     Palpations: Abdomen is soft.  Musculoskeletal: Normal range of motion.     Comments: Severe pain in left hip. ROM and strength are moderately to severely limited due to pain.  Using a walker to help with mobility.    Lymphadenopathy:     Cervical: No cervical adenopathy.  Skin:    General: Skin is warm and dry.  Neurological:     Mental Status: He is alert and oriented to person, place, and time.     Cranial Nerves: No cranial nerve deficit.  Psychiatric:        Behavior: Behavior normal.        Thought Content: Thought content normal.        Judgment: Judgment normal.   Assessment/Plan: 1. Left hip pain The patient should continue to take NSAIDs as needed. Keep scheduled appointment with orthopedics. Work note given, keeping him out of work until his orthopedics appointment, 11/10/2018  2. Difficulty walking Due to severe pain in left hip. Follow up with orthopedics as scheduled.   General Counseling: Dondra Praderarl verbalizes understanding of the findings of todays visit and agrees with plan of treatment. I have discussed any further diagnostic evaluation that may be needed or ordered today. We also reviewed his medications today. he has been encouraged to call the office with any questions or concerns that should arise related to todays visit.    Counseling:  Apply a compressive ACE bandage. Rest and elevate the affected painful area.  Apply cold compresses intermittently as needed.  As pain recedes, begin normal activities slowly as tolerated.  Call if symptoms persist.  This patient was seen by Vincent GrosHeather Tylan Kinn FNP Collaboration with Dr Lyndon CodeFozia M Khan as a part of collaborative care agreement   Time spent: 15 Minutes

## 2018-11-07 DIAGNOSIS — R262 Difficulty in walking, not elsewhere classified: Secondary | ICD-10-CM | POA: Insufficient documentation

## 2018-11-07 DIAGNOSIS — M25552 Pain in left hip: Secondary | ICD-10-CM | POA: Insufficient documentation

## 2018-11-10 DIAGNOSIS — Z96642 Presence of left artificial hip joint: Secondary | ICD-10-CM | POA: Diagnosis not present

## 2018-11-24 DIAGNOSIS — M545 Low back pain: Secondary | ICD-10-CM | POA: Diagnosis not present

## 2018-11-24 DIAGNOSIS — M25552 Pain in left hip: Secondary | ICD-10-CM | POA: Diagnosis not present

## 2018-12-06 DIAGNOSIS — M25512 Pain in left shoulder: Secondary | ICD-10-CM | POA: Diagnosis not present

## 2018-12-06 DIAGNOSIS — M25552 Pain in left hip: Secondary | ICD-10-CM | POA: Diagnosis not present

## 2018-12-07 DIAGNOSIS — M47817 Spondylosis without myelopathy or radiculopathy, lumbosacral region: Secondary | ICD-10-CM | POA: Diagnosis not present

## 2018-12-07 DIAGNOSIS — M5126 Other intervertebral disc displacement, lumbar region: Secondary | ICD-10-CM | POA: Diagnosis not present

## 2018-12-07 DIAGNOSIS — M5127 Other intervertebral disc displacement, lumbosacral region: Secondary | ICD-10-CM | POA: Diagnosis not present

## 2018-12-07 DIAGNOSIS — M47816 Spondylosis without myelopathy or radiculopathy, lumbar region: Secondary | ICD-10-CM | POA: Diagnosis not present

## 2018-12-14 DIAGNOSIS — M545 Low back pain: Secondary | ICD-10-CM | POA: Diagnosis not present

## 2018-12-14 DIAGNOSIS — M25552 Pain in left hip: Secondary | ICD-10-CM | POA: Diagnosis not present

## 2018-12-14 DIAGNOSIS — M5416 Radiculopathy, lumbar region: Secondary | ICD-10-CM | POA: Diagnosis not present

## 2018-12-14 DIAGNOSIS — M48061 Spinal stenosis, lumbar region without neurogenic claudication: Secondary | ICD-10-CM | POA: Diagnosis not present

## 2018-12-17 ENCOUNTER — Encounter: Payer: Self-pay | Admitting: Nurse Practitioner

## 2018-12-21 DIAGNOSIS — M545 Low back pain: Secondary | ICD-10-CM | POA: Diagnosis not present

## 2019-02-18 DIAGNOSIS — M545 Low back pain: Secondary | ICD-10-CM | POA: Diagnosis not present

## 2019-03-04 DIAGNOSIS — M25562 Pain in left knee: Secondary | ICD-10-CM | POA: Diagnosis not present

## 2019-05-05 DIAGNOSIS — M48061 Spinal stenosis, lumbar region without neurogenic claudication: Secondary | ICD-10-CM | POA: Diagnosis not present

## 2019-05-05 DIAGNOSIS — Z96642 Presence of left artificial hip joint: Secondary | ICD-10-CM | POA: Diagnosis not present

## 2019-05-12 DIAGNOSIS — M25552 Pain in left hip: Secondary | ICD-10-CM | POA: Diagnosis not present

## 2019-05-18 DIAGNOSIS — M25552 Pain in left hip: Secondary | ICD-10-CM | POA: Diagnosis not present

## 2019-05-18 DIAGNOSIS — Z96642 Presence of left artificial hip joint: Secondary | ICD-10-CM | POA: Diagnosis not present

## 2019-05-26 DIAGNOSIS — M25552 Pain in left hip: Secondary | ICD-10-CM | POA: Diagnosis not present

## 2019-06-02 DIAGNOSIS — M5416 Radiculopathy, lumbar region: Secondary | ICD-10-CM | POA: Diagnosis not present

## 2019-06-17 DIAGNOSIS — M5416 Radiculopathy, lumbar region: Secondary | ICD-10-CM | POA: Diagnosis not present

## 2019-06-17 DIAGNOSIS — M25552 Pain in left hip: Secondary | ICD-10-CM | POA: Diagnosis not present

## 2019-06-21 DIAGNOSIS — M25552 Pain in left hip: Secondary | ICD-10-CM | POA: Diagnosis not present

## 2019-06-21 DIAGNOSIS — Z96642 Presence of left artificial hip joint: Secondary | ICD-10-CM | POA: Diagnosis not present

## 2019-06-28 DIAGNOSIS — M25552 Pain in left hip: Secondary | ICD-10-CM | POA: Diagnosis not present

## 2019-06-28 DIAGNOSIS — M5416 Radiculopathy, lumbar region: Secondary | ICD-10-CM | POA: Diagnosis not present

## 2019-07-07 DIAGNOSIS — M48062 Spinal stenosis, lumbar region with neurogenic claudication: Secondary | ICD-10-CM | POA: Diagnosis not present

## 2019-07-12 DIAGNOSIS — M4696 Unspecified inflammatory spondylopathy, lumbar region: Secondary | ICD-10-CM | POA: Diagnosis not present

## 2019-07-12 DIAGNOSIS — G894 Chronic pain syndrome: Secondary | ICD-10-CM | POA: Diagnosis not present

## 2019-07-12 DIAGNOSIS — Z79899 Other long term (current) drug therapy: Secondary | ICD-10-CM | POA: Diagnosis not present

## 2019-07-12 DIAGNOSIS — Z5181 Encounter for therapeutic drug level monitoring: Secondary | ICD-10-CM | POA: Diagnosis not present

## 2019-07-13 ENCOUNTER — Other Ambulatory Visit: Payer: Self-pay

## 2019-07-13 ENCOUNTER — Encounter (HOSPITAL_COMMUNITY): Payer: Self-pay | Admitting: *Deleted

## 2019-07-13 ENCOUNTER — Emergency Department (HOSPITAL_COMMUNITY)
Admission: EM | Admit: 2019-07-13 | Discharge: 2019-07-28 | Disposition: E | Payer: BC Managed Care – PPO | Attending: Emergency Medicine | Admitting: Emergency Medicine

## 2019-07-13 DIAGNOSIS — Z79899 Other long term (current) drug therapy: Secondary | ICD-10-CM | POA: Insufficient documentation

## 2019-07-13 DIAGNOSIS — I469 Cardiac arrest, cause unspecified: Secondary | ICD-10-CM | POA: Insufficient documentation

## 2019-07-13 MED ORDER — EPINEPHRINE 1 MG/10ML IJ SOSY
PREFILLED_SYRINGE | INTRAMUSCULAR | Status: AC | PRN
  Administered 2019-07-13 (×4): 1 mg via INTRAVENOUS

## 2019-07-13 MED ORDER — SODIUM BICARBONATE 8.4 % IV SOLN
INTRAVENOUS | Status: AC | PRN
  Administered 2019-07-13: 50 meq via INTRAVENOUS

## 2019-07-13 NOTE — ED Triage Notes (Addendum)
[facility] - called from home for difficulty breathing, having SOB x2 days. Pt walked to stretcher, sob increased, pt had syncopal episode, lost pulses, cpr in progress, ems reports pt rhythm was pea, was given two separate doses of epi 1mg  iv while enroute to er, , upon arrival to er pt asystole, cpr continued, Dr Roderic Palau at bedside,

## 2019-07-13 NOTE — ED Notes (Signed)
CPR continues,  Dr Roderic Palau remains at bedside,

## 2019-07-13 NOTE — ED Notes (Signed)
Pt remains asystole, cpr continues, Dr Roderic Palau at bedside,

## 2019-07-18 MED FILL — Medication: Qty: 1 | Status: AC

## 2019-07-28 NOTE — ED Provider Notes (Signed)
[facility] EMERGENCY DEPARTMENT Provider Note   CSN: [id number] Arrival date & time: 07/06/2019  2202     History   Chief Complaint Chief Complaint  Patient presents with  . cpr    HPI [name] is a 48 y.o. male.     Paramedics stated they were called to the house and the patient was very short of breath.  Shortly after they got there the patient arrested and was in asystole.  The history is provided by the EMS personnel and medical records. No language interpreter was used.  Cardiac Arrest Witnessed by:  Healthcare provider Incident location:  Home Time before BLS initiated:  Immediate Time before ALS initiated:  Immediate Condition upon EMS arrival:  Apneic Pulse:  Absent Initial cardiac rhythm per EMS:  Asystole Treatments prior to arrival:  ACLS protocol Medications given prior to ED:  Epinephrine Airway:  Bag valve mask Rhythm on admission to ED:  Unchanged Associated symptoms: no chest pain   Risk factors: no COPD     Past Medical History:  Diagnosis Date  . Arthritis   . Chronic kidney disease    kidney stones  . Edema leg   . Sleep apnea     Patient Active Problem List   Diagnosis Date Noted  . Left hip pain 11/07/2018  . Difficulty walking 11/07/2018  . Primary osteoarthritis of right hip 09/03/2015    Past Surgical History:  Procedure Laterality Date  . REPLACEMENT TOTAL HIP W/  RESURFACING IMPLANTS Left   . STERIOD INJECTION N/A 01/19/2013   Procedure: STEROID INJECTION;  Surgeon: Gwynne Edinger, MD;  Location: [facility];  Service: Neurosurgery;  Laterality: N/A;  Lumbar four - five Lumbar Epidural Steroid Injection        Home Medications    Prior to Admission medications   Medication Sig Start Date End Date Taking? Authorizing Provider  cyclobenzaprine (FLEXERIL) 5 MG tablet Take 5 mg by mouth 3 (three) times daily as needed for muscle spasms.  06/22/19   [provider]  furosemide (LASIX) 20 MG tablet Take 1 tablet  (20 mg total) by mouth daily. 10/29/18   Johnna Acosta, NP  Misc Natural Products (OSTEO BI-FLEX JOINT SHIELD PO) Take 1 tablet by mouth daily.     [provider]  naproxen sodium (ALEVE) 220 MG tablet Take 220 mg by mouth 2 (two) times daily as needed (for pain).     [provider]  oxyCODONE (OXY IR/ROXICODONE) 5 MG immediate release tablet Take 5 mg by mouth every 6 (six) hours as needed.  06/21/19   [provider]  terbinafine (LAMISIL) 250 MG tablet Take 1 tablet (250 mg total) by mouth daily. Patient not taking: Reported on 10/29/2018 07/02/18   Felecia Shelling, DPM    Family History Family History  Problem Relation Age of Onset  . Diabetes Mother   . Cancer Mother   . Heart disease Father     Social History Social History   Tobacco Use  . Smoking status: Never Smoker  . Smokeless tobacco: Never Used  Substance Use Topics  . Alcohol use: Yes    Comment: rarely  . Drug use: No     Allergies   Patient has no known allergies.   Review of Systems Review of Systems  Unable to perform ROS: Mental status change  Cardiovascular: Negative for chest pain.     Physical Exam Updated Vital Signs Ht 6' (1.829 m)   Noland Fordyce Marland Kitchen)  172.8 kg   BMI 51.67 kg/m   Physical Exam Vitals signs and nursing note reviewed.  Constitutional:      Appearance: He is well-developed.     Comments: CPR being done  HENT:     Head: Normocephalic.     Nose: Nose normal.  Eyes:     General: No scleral icterus.    Conjunctiva/sclera: Conjunctivae normal.  Neck:     Musculoskeletal: Neck supple.     Thyroid: No thyromegaly.  Cardiovascular:     Heart sounds: No murmur. No friction rub. No gallop.      Comments: Asystole Pulmonary:     Breath sounds: No stridor. No wheezing or rales.     Comments: Patient having rounds with bag mouth Abdominal:     General: There is no distension.     Tenderness: There is no abdominal tenderness. There is no rebound.   Musculoskeletal:     Comments: Patient not moving any extremities  Lymphadenopathy:     Cervical: No cervical adenopathy.  Skin:    General: Skin is dry.     Coloration: Skin is pale.     Findings: No erythema or rash.  Neurological:     Motor: No abnormal muscle tone.     Coordination: Coordination normal.     Comments: Patient not awake  Psychiatric:     Comments: CPR being done      ED Treatments / Results  Labs (all labs ordered are listed, but only abnormal results are displayed) Labs Reviewed - No data to display  EKG None  Radiology No results found.  Procedures Procedure Name: Intubation Date/Time: 17-Jul-2019 12:59 PM Performed by: Milton Ferguson, MD Pre-anesthesia Checklist: Patient identified Oxygen Delivery Method: Simple face mask Preoxygenation: Pre-oxygenation with 100% oxygen Ventilation: Mask ventilation without difficulty Laryngoscope Size: Glidescope Grade View: Grade I Tube type: Non-subglottic suction tube Number of attempts: 1 Airway Equipment and Method: Stylet Comments: Patient was intubated with a 7-1/2 tube without any problem using a glide scope      (including critical care time)  Medications Ordered in ED Medications  EPINEPHrine (ADRENALIN) 1 MG/10ML injection (1 mg Intravenous Given 07/01/2019 2214)  sodium bicarbonate injection (50 mEq Intravenous Given 07/20/2019 2212)     Initial Impression / Assessment and Plan / ED Course  I have reviewed the triage vital signs and the nursing notes.  Pertinent labs & imaging results that were available during my care of the patient were reviewed by me and considered in my medical decision making (see chart for details).    CRITICAL CARE Performed by: Milton Ferguson Total critical care time: 60 minutes Critical care time was exclusive of separately billable procedures and treating other patients. Critical care was necessary to treat or prevent imminent or life-threatening deterioration.  Critical care was time spent personally by me on the following activities: development of treatment plan with patient and/or surrogate as well as nursing, discussions with consultants, evaluation of patient's response to treatment, examination of patient, obtaining history from patient or surrogate, ordering and performing treatments and interventions, ordering and review of laboratory studies, ordering and review of radiographic studies, pulse oximetry and re-evaluation of patient's condition. Appropriate ACLS was conducted on this patient for at least 40 minutes he remained asystole except for short period time we had a pulse but no blood pressure.  Patient was pronounced dead.    Appropriate CPR was done for 40 minutes without regaining blood pressure.  Patient was pronounced dead at 10:17 PM.  Medical examiner was called and will decide on making an ME case.  Family was notified.  Patient supposedly did not have medical problems except for bilateral hip replacements.  With obesity of almost 400 pounds Final Clinical Impressions(s) / ED Diagnoses   Final diagnoses:  Cardiac arrest [facility])    ED Discharge Orders    None       Bethann Berkshire, MD 06/30/2019 1302

## 2019-07-28 NOTE — ED Notes (Signed)
Pt's wallet, a pair of socks, and a towel was sent to the morgue with body.

## 2019-07-28 DEATH — deceased
